# Patient Record
Sex: Female | Born: 1965 | Race: White | Hispanic: No | Marital: Married | Smoking: Never smoker
Health system: Southern US, Community
[De-identification: ages and names within clinical notes are randomized; demographics above are authoritative.]

## PROBLEM LIST (undated history)

## (undated) DIAGNOSIS — G8929 Other chronic pain: Secondary | ICD-10-CM

## (undated) DIAGNOSIS — R011 Cardiac murmur, unspecified: Secondary | ICD-10-CM

## (undated) DIAGNOSIS — Z86718 Personal history of other venous thrombosis and embolism: Secondary | ICD-10-CM

## (undated) DIAGNOSIS — I82409 Acute embolism and thrombosis of unspecified deep veins of unspecified lower extremity: Secondary | ICD-10-CM

## (undated) DIAGNOSIS — G43909 Migraine, unspecified, not intractable, without status migrainosus: Secondary | ICD-10-CM

## (undated) DIAGNOSIS — M797 Fibromyalgia: Secondary | ICD-10-CM

## (undated) DIAGNOSIS — M1990 Unspecified osteoarthritis, unspecified site: Secondary | ICD-10-CM

## (undated) DIAGNOSIS — D649 Anemia, unspecified: Secondary | ICD-10-CM

## (undated) DIAGNOSIS — R569 Unspecified convulsions: Secondary | ICD-10-CM

## (undated) DIAGNOSIS — M25511 Pain in right shoulder: Secondary | ICD-10-CM

## (undated) DIAGNOSIS — D689 Coagulation defect, unspecified: Secondary | ICD-10-CM

## (undated) DIAGNOSIS — R110 Nausea: Secondary | ICD-10-CM

## (undated) DIAGNOSIS — M549 Dorsalgia, unspecified: Secondary | ICD-10-CM

## (undated) DIAGNOSIS — E785 Hyperlipidemia, unspecified: Secondary | ICD-10-CM

## (undated) HISTORY — DX: Pain in right shoulder: M25.511

## (undated) HISTORY — PX: IVC FILTER INSERTION: CATH118245

## (undated) HISTORY — DX: Personal history of other venous thrombosis and embolism: Z86.718

## (undated) HISTORY — DX: Anemia, unspecified: D64.9

## (undated) HISTORY — DX: Nausea: R11.0

## (undated) HISTORY — PX: LEG SURGERY: SHX1003

## (undated) HISTORY — PX: SUBDURAL HEMATOMA EVACUATION VIA CRANIOTOMY: SUR319

## (undated) HISTORY — DX: Fibromyalgia: M79.7

## (undated) HISTORY — PX: LAPAROSCOPIC GASTRIC BANDING: SHX1100

## (undated) HISTORY — DX: Coagulation defect, unspecified: D68.9

---

## 1999-03-15 ENCOUNTER — Encounter: Payer: Self-pay | Admitting: Neurology

## 1999-03-15 ENCOUNTER — Ambulatory Visit (HOSPITAL_COMMUNITY): Admission: RE | Admit: 1999-03-15 | Discharge: 1999-03-15 | Payer: Self-pay | Admitting: Neurology

## 1999-03-21 ENCOUNTER — Ambulatory Visit: Admission: RE | Admit: 1999-03-21 | Discharge: 1999-03-21 | Payer: Self-pay | Admitting: Neurology

## 2000-02-15 ENCOUNTER — Other Ambulatory Visit: Admission: RE | Admit: 2000-02-15 | Discharge: 2000-02-15 | Payer: Self-pay | Admitting: Obstetrics and Gynecology

## 2003-08-25 ENCOUNTER — Other Ambulatory Visit: Admission: RE | Admit: 2003-08-25 | Discharge: 2003-08-25 | Payer: Self-pay | Admitting: Obstetrics and Gynecology

## 2004-05-12 ENCOUNTER — Emergency Department: Payer: Self-pay | Admitting: Emergency Medicine

## 2004-05-12 ENCOUNTER — Other Ambulatory Visit: Payer: Self-pay

## 2004-07-10 ENCOUNTER — Encounter: Admission: RE | Admit: 2004-07-10 | Discharge: 2004-07-10 | Payer: Self-pay | Admitting: Internal Medicine

## 2004-08-21 ENCOUNTER — Ambulatory Visit: Payer: Self-pay | Admitting: Pain Medicine

## 2004-08-23 ENCOUNTER — Ambulatory Visit: Payer: Self-pay | Admitting: Pain Medicine

## 2004-09-25 ENCOUNTER — Ambulatory Visit: Payer: Self-pay | Admitting: Pain Medicine

## 2004-10-05 ENCOUNTER — Ambulatory Visit: Payer: Self-pay | Admitting: Pain Medicine

## 2004-10-12 ENCOUNTER — Ambulatory Visit: Payer: Self-pay | Admitting: Pain Medicine

## 2004-10-19 ENCOUNTER — Ambulatory Visit: Payer: Self-pay | Admitting: Pain Medicine

## 2004-10-26 ENCOUNTER — Ambulatory Visit: Payer: Self-pay | Admitting: Pain Medicine

## 2004-11-16 ENCOUNTER — Ambulatory Visit: Payer: Self-pay | Admitting: Pain Medicine

## 2004-12-01 ENCOUNTER — Ambulatory Visit: Payer: Self-pay | Admitting: Physician Assistant

## 2005-01-29 ENCOUNTER — Ambulatory Visit: Payer: Self-pay | Admitting: Physician Assistant

## 2005-04-04 ENCOUNTER — Ambulatory Visit: Payer: Self-pay | Admitting: Pain Medicine

## 2006-08-27 ENCOUNTER — Encounter: Admission: RE | Admit: 2006-08-27 | Discharge: 2006-08-27 | Payer: Self-pay | Admitting: Orthopaedic Surgery

## 2006-09-16 ENCOUNTER — Ambulatory Visit (HOSPITAL_COMMUNITY): Admission: RE | Admit: 2006-09-16 | Discharge: 2006-09-17 | Payer: Self-pay | Admitting: Orthopaedic Surgery

## 2006-11-25 ENCOUNTER — Ambulatory Visit: Payer: Self-pay | Admitting: Internal Medicine

## 2007-04-30 ENCOUNTER — Ambulatory Visit (HOSPITAL_COMMUNITY): Admission: RE | Admit: 2007-04-30 | Discharge: 2007-04-30 | Payer: Self-pay | Admitting: Surgery

## 2007-05-01 ENCOUNTER — Ambulatory Visit (HOSPITAL_COMMUNITY): Admission: RE | Admit: 2007-05-01 | Discharge: 2007-05-01 | Payer: Self-pay | Admitting: Surgery

## 2007-05-19 ENCOUNTER — Encounter: Admission: RE | Admit: 2007-05-19 | Discharge: 2007-08-17 | Payer: Self-pay | Admitting: Surgery

## 2007-08-25 ENCOUNTER — Ambulatory Visit (HOSPITAL_COMMUNITY): Admission: RE | Admit: 2007-08-25 | Discharge: 2007-08-25 | Payer: Self-pay | Admitting: Surgery

## 2007-10-14 ENCOUNTER — Encounter: Admission: RE | Admit: 2007-10-14 | Discharge: 2008-01-12 | Payer: Self-pay | Admitting: Surgery

## 2007-10-24 ENCOUNTER — Ambulatory Visit (HOSPITAL_COMMUNITY): Admission: RE | Admit: 2007-10-24 | Discharge: 2007-10-24 | Payer: Self-pay | Admitting: Surgery

## 2007-10-27 ENCOUNTER — Inpatient Hospital Stay (HOSPITAL_COMMUNITY): Admission: RE | Admit: 2007-10-27 | Discharge: 2007-11-04 | Payer: Self-pay | Admitting: Surgery

## 2007-10-28 ENCOUNTER — Encounter (INDEPENDENT_AMBULATORY_CARE_PROVIDER_SITE_OTHER): Payer: Self-pay | Admitting: Surgery

## 2007-10-28 ENCOUNTER — Ambulatory Visit: Payer: Self-pay | Admitting: Surgery

## 2008-02-03 ENCOUNTER — Encounter: Admission: RE | Admit: 2008-02-03 | Discharge: 2008-02-03 | Payer: Self-pay | Admitting: Surgery

## 2008-05-04 ENCOUNTER — Encounter: Admission: RE | Admit: 2008-05-04 | Discharge: 2008-05-26 | Payer: Self-pay | Admitting: Surgery

## 2008-08-07 ENCOUNTER — Inpatient Hospital Stay: Payer: Self-pay | Admitting: General Surgery
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
## 2010-12-05 NOTE — Op Note (Signed)
NAMECHARLOTTA, Lorraine Hurst            ACCOUNT NO.:  [id number]   MEDICAL RECORD NO.:  [id number]          PATIENT TYPE:  INP   LOCATION:  1240                         FACILITY:  [facility]   PHYSICIAN:  Thornton Park. Daphine Deutscher, MD  DATE OF BIRTH:  [birth year]   DATE OF PROCEDURE:  10/29/2007  DATE OF DISCHARGE:                               OPERATIVE REPORT   PREOPERATIVE INDICATIONS:  Lekesha Claw is postop day #2 lap Roux-en-  Y gastric bypass and had had a good swallow yesterday.  This morning she  woke up with some nausea and retching.  Complained of distention and  abdominal pain.  She was seen by me and taken to the x-ray where  gastrografin swallow was administered.  This showed filling of the  pouch, no leak but it did not empty into the Roux limb.  Therefore I  recommend we take her to the operating room for laparoscopy and  endoscopy.   PROCEDURE:  Laparoscopy and upper endoscopy.   SURGEON:  Thornton Park. Daphine Deutscher, MD   ASSISTANT:  Anselm Pancoast. Zachery Dakins, M.D. and Sandria Bales. Ezzard Standing, M.D.   ANESTHESIA:  General endotracheal.   DESCRIPTION OF PROCEDURE:  Ms. Manthei was taken to OR 1 on the  afternoon of October 28, 1988 given general anesthesia.  The abdomen was  prepped with Techni-Care and draped sterilely.  I ended up opening three  incisions, two toward the umbilicus and one that I initially went in was  in left upper quadrant.  It was the initial site of the OptiVu approach.  Upon entering the abdomen, the entire small bowel was so distended I  could not really see anything.  I, however, had a safe entrance and did  not see any evidence of any fluid or contamination or leak.  I therefore  went straight away to the endoscopy and I passed the endoscope into the  small pouch, did not see any bleeding.  I was able to cannulate and get  into the small bowel which was distended.  I passed the scope easily  down the outlet as it was distended and sucked on this to decompress the  bowel.   As we performed I went ahead and had Dr. Zachery Dakins come up and  intermittently press the suction device.  I went back in with the  laparoscopic and gentle massaged the bowel and we got this area  deflated.  I was then able to put a second trocar in to the left of the  umbilicus and got the angled scope and put that in and was able to look  and identify the right colon which seemed to be distended with air.  The  transverse colon was distended with air and everything appeared to be as  if she had aerophagia with a lot of gas and an ileus.  There is no  transition point.  I was unable to see the JJ as the bowel loops were  pretty distended around there but as I massaged this and got it to  decompress.  I saw good peristaltic waves in the proximal foregut.  And  with sequential  massage again the suction seemed to decompress the  bowel.  We elected to at that point pass the NG tube into down near the  EG junction and stopped based on the impression that this was a big  ileus as there was gas throughout the bowel with dilatation and no  transition point.   In inspecting everything I could up underneath the liver and did not see  any evidence of any perforation.  With initial insufflation and  inspection there was no evidence of bubbles and no evidence of any leak  or peritonitis.  At completion I decompressed the abdomen of air and  then closed the three openings with staples.  The patient was then taken  back to the recovery room prior to transfer back to the ICU step-down  unit.      Thornton Park Daphine Deutscher, MD  Electronically Signed     MBM/MEDQ  D:  10/29/2007  T:  10/30/2007  Job:  [id number]

## 2010-12-05 NOTE — Discharge Summary (Signed)
NAMEAMADEA, KEAGY            ACCOUNT NO.:  [id number]   MEDICAL RECORD NO.:  [id number]          PATIENT TYPE:  INP   LOCATION:  1614                         FACILITY:  [facility]   PHYSICIAN:  Thornton Park. Daphine Deutscher, MD  DATE OF BIRTH:  1965/07/26   DATE OF ADMISSION:  10/27/2007  DATE OF DISCHARGE:  11/04/2007                               DISCHARGE SUMMARY   PROCEDURES:  1. On October 27, 2007, laparoscopic Roux-en-Y gastric bypass.  2. On October 29, 2007, laparoscopy with upper endoscopy and evacuation      of ileus.   HOSPITAL COURSE:  Lorraine Hurst is a 45 year old white female, BMI of  78, who came in and underwent a laparoscopic Roux-en-Y gastric bypass.  Postoperatively, her initial swallow looked good and she had retrieval  of a Greenfield filter placed preoperatively.  On Doppler study on October 28, 2007, had no evidence of DVT or superficial thrombosis.  On postop  day #2, she had some problems with retching and probably aerophagic as  she had preexistent reflux.  She got distended and having a lot of dry  heaves and pain, so I took her to the operating room on October 29, 2007,  and went back into one of her ports and found her distended bowel  throughout, consistent with an aerophagic ileus.  Concomitantly, we put  down the endoscope to look at her pouch and decompressed her through  that by going ahead and endoscoping the Roux-en-Y and evacuating a lot  of air.  At that time, I saw no evidence of any trocar hernias and no  internal hernias and there was no evidence of a leak.  I went ahead and  put her on TNA for a few days and we watched her until she was passing  gas and having some loose stools. In the meantime, had on TNA which we  weaned and started her back on the protein shakes.  She was getting  around a little slowly and not quite ready for discharge on November 03, 2007, but by November 04, 2007, she was taking her shakes and ready for  discharge.  Her staples will be  removed prior to discharge.  We have  asked her to see Dr. Bethann Punches as soon as possible to change some of  her meds, possibly going to a liquid form of potassium chloride.  I have  also instructed her to restart her Coumadin tomorrow, and she will  follow up with him in 1 week.  I will see her back in 2-3 weeks since  she has been here in the hospital and will not need to come and see me  tomorrow unless needed.  Condition stable and improved.  Hemoglobin  stable.   FINAL DIAGNOSIS:  Morbidly obese young lady, status post laparoscopic  Roux-en-Y gastric bypass beginning with a body mass index of 63.      Thornton Park Daphine Deutscher, MD  Electronically Signed     MBM/MEDQ  D:  11/04/2007  T:  11/04/2007  Job:  [id number]

## 2010-12-05 NOTE — Op Note (Signed)
Lorraine Hurst, Lorraine Hurst            ACCOUNT NO.:  [id number]   MEDICAL RECORD NO.:  [id number]          PATIENT TYPE:  INP   LOCATION:  1240                         FACILITY:  [facility]   PHYSICIAN:  Sharlet Salina T. Hoxworth, M.D.DATE OF BIRTH:  May 13, 1966   DATE OF PROCEDURE:  10/27/2007  DATE OF DISCHARGE:                               OPERATIVE REPORT   PROCEDURE:  Upper gastrointestinal endoscopy.   DESCRIPTION OF PROCEDURE:  Upper GI endoscopy is performed at the  completion of laparoscopic Roux-en-Y gastric bypass by Dr. Daphine Deutscher.  The  Olympus video endoscope was inserted into the upper esophagus and then  passed under direct vision to the EG junction.  There was a small  sliding hiatal hernia.  The small gastric pouch was entered and then was  tensely distended with air while the outlet was clamped under saline  irrigation by Dr. Daphine Deutscher.  There was no evidence of leak.  The  anastomosis was visualized and patent.  Suture and staple lines appeared  intact and without bleeding.  The gastric mucosa was normal.  The pouch  was small measuring about 3-4 cm in length.  Following completion of  procedure the pouch was desufflated and the scope withdrawn.      [name]. Hoxworth, M.D.  Electronically Signed     BTH/MEDQ  D:  10/27/2007  T:  10/27/2007  Job:  [id number]

## 2011-04-16 LAB — BASIC METABOLIC PANEL
BUN: 15
CO2: 31
Chloride: 96
Creatinine, Ser: 0.75

## 2011-04-17 LAB — CBC
HCT: 43.3
HCT: 44.4
Hemoglobin: 11.7 — ABNORMAL LOW
Hemoglobin: 12.3
Hemoglobin: 12.8
Hemoglobin: 13.2
Hemoglobin: 14
Hemoglobin: 14.8
MCHC: 32.3
MCHC: 33.4
MCHC: 33.9
MCHC: 34.6
MCV: 79.7
MCV: 80.7
MCV: 80.7
MCV: 80.9
Platelets: 186
Platelets: 197
Platelets: 256
RBC: 4.6
RBC: 4.86
RDW: 14.3
RDW: 14.8
RDW: 14.9
RDW: 15
RDW: 15.1
WBC: 11.4 — ABNORMAL HIGH
WBC: 12.5 — ABNORMAL HIGH
WBC: 12.5 — ABNORMAL HIGH

## 2011-04-17 LAB — DIFFERENTIAL
Basophils Absolute: 0
Basophils Absolute: 0
Basophils Absolute: 0.1
Basophils Relative: 0
Basophils Relative: 1
Basophils Relative: 10 — ABNORMAL HIGH
Eosinophils Absolute: 0
Eosinophils Absolute: 0.1
Eosinophils Absolute: 0.2
Eosinophils Absolute: 0.2
Eosinophils Relative: 1
Lymphocytes Relative: 5 — ABNORMAL LOW
Lymphs Abs: 0.5 — ABNORMAL LOW
Lymphs Abs: 1.1
Lymphs Abs: 1.2
Lymphs Abs: 1.5
Monocytes Absolute: 0.6
Monocytes Absolute: 0.9
Monocytes Absolute: 1
Monocytes Absolute: 1.1 — ABNORMAL HIGH
Monocytes Relative: 10
Monocytes Relative: 8
Monocytes Relative: 9
Neutro Abs: 10 — ABNORMAL HIGH
Neutro Abs: 11.6 — ABNORMAL HIGH
Neutro Abs: 12.5 — ABNORMAL HIGH
Neutro Abs: 26.1 — ABNORMAL HIGH
Neutro Abs: 7.5
Neutrophils Relative %: 66
Neutrophils Relative %: 80 — ABNORMAL HIGH
Neutrophils Relative %: 85 — ABNORMAL HIGH

## 2011-04-17 LAB — BASIC METABOLIC PANEL
BUN: 18
CO2: 24
CO2: 28
Chloride: 102
Chloride: 105
Chloride: 94 — ABNORMAL LOW
Creatinine, Ser: 0.57
GFR calc Af Amer: >60
Glucose, Bld: 119 — ABNORMAL HIGH
Glucose, Bld: 209 — ABNORMAL HIGH
Potassium: 3.3 — ABNORMAL LOW
Potassium: 4.2
Sodium: 135
Sodium: 138
Sodium: 138

## 2011-04-17 LAB — COMPREHENSIVE METABOLIC PANEL
ALT: 50 — ABNORMAL HIGH
AST: 12
Albumin: 2.3 — ABNORMAL LOW
Alkaline Phosphatase: 43
Calcium: 8.7
GFR calc Af Amer: >60
Glucose, Bld: 148 — ABNORMAL HIGH
Potassium: 4
Sodium: 139
Total Protein: 5 — ABNORMAL LOW

## 2011-04-17 LAB — PROTIME-INR: Prothrombin Time: 13.1

## 2011-04-17 LAB — HEMOGLOBIN AND HEMATOCRIT, BLOOD
HCT: 37.9
Hemoglobin: 12.3
Hemoglobin: 12.8

## 2011-04-17 LAB — APTT: aPTT: 28

## 2011-04-17 LAB — TRIGLYCERIDES: Triglycerides: 133

## 2011-04-20 ENCOUNTER — Telehealth (INDEPENDENT_AMBULATORY_CARE_PROVIDER_SITE_OTHER): Payer: Self-pay | Admitting: Surgery

## 2011-04-20 NOTE — Telephone Encounter (Signed)
04/20/11 recall letter mailed to pt for gastric bypass f/u. Advised pt to call our office to schedule an appt...cef

## 2011-07-23 ENCOUNTER — Inpatient Hospital Stay: Payer: Self-pay | Admitting: Internal Medicine

## 2011-07-24 LAB — CBC WITH DIFFERENTIAL/PLATELET
Basophil %: 0 %
Eosinophil #: 0 10*3/uL (ref 0.0–0.7)
HCT: 34 % — ABNORMAL LOW (ref 35.0–47.0)
HGB: 11.2 g/dL — ABNORMAL LOW (ref 12.0–16.0)
Lymphocyte %: 8.7 %
MCHC: 33 g/dL (ref 32.0–36.0)
Neutrophil #: 11.1 10*3/uL — ABNORMAL HIGH (ref 1.4–6.5)
WBC: 13.8 10*3/uL — ABNORMAL HIGH (ref 3.6–11.0)

## 2011-07-24 LAB — URINALYSIS, COMPLETE
Bilirubin,UR: NEGATIVE
Ph: 7 (ref 4.5–8.0)
Protein: 30
RBC,UR: 8 /HPF (ref 0–5)
Specific Gravity: 1.025 (ref 1.003–1.030)
Squamous Epithelial: 5
WBC UR: 5 /HPF (ref 0–5)

## 2011-07-24 LAB — BASIC METABOLIC PANEL
Chloride: 105 mmol/L (ref 98–107)
Co2: 27 mmol/L (ref 21–32)
Creatinine: 0.71 mg/dL (ref 0.60–1.30)
Sodium: 141 mmol/L (ref 136–145)

## 2011-07-25 LAB — CBC WITH DIFFERENTIAL/PLATELET
Basophil #: 0 10*3/uL (ref 0.0–0.1)
Basophil %: 0.4 %
Eosinophil #: 0.1 10*3/uL (ref 0.0–0.7)
HCT: 34.3 % — ABNORMAL LOW (ref 35.0–47.0)
HGB: 11.4 g/dL — ABNORMAL LOW (ref 12.0–16.0)
Lymphocyte %: 16.9 %
MCV: 87 fL (ref 80–100)
Monocyte #: 0.7 10*3/uL (ref 0.0–0.7)
Monocyte %: 9.1 %
Neutrophil #: 5.9 10*3/uL (ref 1.4–6.5)
RDW: 13.4 % (ref 11.5–14.5)

## 2011-07-25 LAB — BASIC METABOLIC PANEL
BUN: 7 mg/dL (ref 7–18)
Co2: 26 mmol/L (ref 21–32)
Creatinine: 0.57 mg/dL — ABNORMAL LOW (ref 0.60–1.30)
EGFR (African American): >60
EGFR (Non-African Amer.): >60

## 2011-07-25 LAB — VANCOMYCIN, TROUGH: Vancomycin, Trough: 20 ug/mL (ref 10–20)

## 2011-07-26 LAB — CBC WITH DIFFERENTIAL/PLATELET
HCT: 32.4 % — ABNORMAL LOW (ref 35.0–47.0)
Lymphocyte #: 1.8 10*3/uL (ref 1.0–3.6)
MCH: 28.7 pg (ref 26.0–34.0)
MCV: 87 fL (ref 80–100)
Monocyte #: 0.7 10*3/uL (ref 0.0–0.7)
Monocyte %: 10 %
Neutrophil #: 3.9 10*3/uL (ref 1.4–6.5)
Platelet: 174 10*3/uL (ref 150–440)
RDW: 13.3 % (ref 11.5–14.5)
WBC: 6.5 10*3/uL (ref 3.6–11.0)

## 2011-07-26 LAB — BASIC METABOLIC PANEL
Calcium, Total: 8.6 mg/dL (ref 8.5–10.1)
EGFR (Non-African Amer.): >60
Glucose: 77 mg/dL (ref 65–99)
Potassium: 4.2 mmol/L (ref 3.5–5.1)
Sodium: 144 mmol/L (ref 136–145)

## 2011-08-16 ENCOUNTER — Ambulatory Visit: Payer: Self-pay | Admitting: Internal Medicine

## 2011-09-06 ENCOUNTER — Ambulatory Visit: Payer: Self-pay | Admitting: Podiatry

## 2011-10-02 ENCOUNTER — Ambulatory Visit: Payer: Self-pay | Admitting: Anesthesiology

## 2011-10-02 LAB — HEMOGLOBIN: HGB: 12.8 g/dL (ref 12.0–16.0)

## 2011-10-04 ENCOUNTER — Ambulatory Visit: Payer: Self-pay | Admitting: Otolaryngology

## 2011-10-04 LAB — PREGNANCY, URINE: Pregnancy Test, Urine: NEGATIVE m[IU]/mL

## 2011-10-05 LAB — PATHOLOGY REPORT

## 2012-07-24 ENCOUNTER — Telehealth (INDEPENDENT_AMBULATORY_CARE_PROVIDER_SITE_OTHER): Payer: Self-pay | Admitting: Surgery

## 2012-07-24 NOTE — Telephone Encounter (Signed)
06/13/12 mailed recall letter for bariatric surgery follow-up to pt. Advised pt to call [facility] at [phone] to °schedule appt. (lss) ° °

## 2012-12-05 ENCOUNTER — Telehealth: Payer: Self-pay | Admitting: Neurology

## 2014-11-14 NOTE — Discharge Summary (Signed)
PATIENT NAME:  Lorraine Hurst, [name] MR#:  [id number] DATE OF BIRTH:  [birth year]  DATE OF ADMISSION:  07/23/2011 DATE OF DISCHARGE:  07/26/2011  FINAL DIAGNOSES:  1. Acute bilateral tonsillitis.  2. Sepsis secondary to #1.  3. Cervical lymphadenitis.  4. Migraine headaches.  5. Sleep apnea.  6. Remote history of deep venous thrombosis.  7. History of lap band procedure.   HISTORY AND PHYSICAL: Please see dictated admission history and physical.   HOSPITAL COURSE: The patient was admitted with a severe sore throat, marked tonsillitis, no evidence of sepsis as indicated by fever, tachycardia, elevation in white blood cell count, low blood pressure. Fortunately, she responded to broad-spectrum antibiotics and fluids. Culture of the throat was sent, which grew out only normal oral flora. ENT saw the patient and recommended continued IV antibiotics. This was changed over to Augmentin once her toleration of orals was evident and she showed continued improvement on this, with resolution of fevers, normalization of white blood cell count, heart rate, and blood pressure. She was tolerating diet and this was advanced to soft without significant issues.   ENT recommended that she follow-up with them after completion of her antibiotics for consideration for a tonsillectomy and this will be arranged. She will otherwise be discharged home in stable condition with physical activity up as tolerated. She may return to work within one week. She should follow a bland soft diet for the next one week, then go to low sodium. She certainly may use full liquids instead if she feels like this is still bothering her throat. Anticipation is for her follow-up with Dr. Gertie BaronMadison Clark in the next two weeks.   DISCHARGE MEDICATIONS:  1. Augmentin 875 mg p.o. twice a day with food x10 days.  2. Norco 5/325 milligrams, 1 p.o. every six hours p.r.n. severe pain.  3. Klonopin 1 mg p.o. at bedtime.  4. Mag-Ox 400 mg p.o. daily, to  be restarted in one week.  5. Lamotrigine 100 mg p.o. at bedtime.  6. Triamcinolone cream twice a day as needed for rash, which appears to be a heat rash.  7. Torsemide 20 mg daily, may go to 20 mg b.i.d. if needed for edema.  8. Potassium 20 mEq p.o. daily, may go to b.i.d. if using the torsemide twice a day.  9. Vitamin D 1000 units p.o. daily.  10. Imitrex 100 mg p.o. daily as needed for migraine.    ____________________________ Lynnea FerrierBert J. Juanangel Soderholm III, MD bjk:rbg D: 07/26/2011 07:10:13 ET T: 07/26/2011 14:48:56 ET JOB#: [id number]  cc: Curtis SitesBert J. Jemel Ono III, MD, <Dictator> Daniel NonesBERT Kyla Duffy MD ELECTRONICALLY SIGNED 07/30/2011 7:38

## 2014-11-14 NOTE — Op Note (Signed)
PATIENT NAME:  Lorraine Hurst, [name] MR#:  [id number] DATE OF BIRTH:  03-17-1966  DATE OF PROCEDURE:  10/04/2011  PREOPERATIVE DIAGNOSIS:  Chronic tonsillitis.  POSTOPERATIVE DIAGNOSIS:  Chronic tonsillitis.  OPERATION:  Tonsillectomy.  SURGEON:  Zackery BarefootJ. Madison Jacon Whetzel, MD  ANESTHESIA:  General endotracheal.  OPERATIVE FINDINGS:  The tonsils were 3+ chronically and cryptically inflamed.  DESCRIPTION OF THE PROCEDURE:  Darl PikesSusan was identified in the holding area and taken to the operating room and placed in the supine position.  After general endotracheal anesthesia, the table was turned 45 degrees and the patient was draped in the usual fashion for a tonsillectomy.  A mouth gag was inserted into the oral cavity and examination of the oropharynx showed the uvula was non-bifid.  There was no evidence of submucous cleft to the palate.  There were large tonsils.  Beginning on the left-hand side a tenaculum was used to grasp the tonsil and the Bovie cautery was used to dissect it free from the fossa.  In a similar fashion, the right tonsil was removed.  Meticulous hemostasis was achieved using the Bovie cautery.  With both tonsils removed and no active bleeding, 0.5% plain Marcaine was used to inject the anterior and posterior tonsillar pillars bilaterally.  A total of 6 mL was used.  The patient tolerated the procedure well and was awakened in the operating room and taken to the recovery room in stable condition.   CULTURES:  None. SPECIMENS:  Tonsils. ESTIMATED BLOOD LOSS:  Less than 10 mL. ____________________________ J. Gertie BaronMadison Salif Tay, MD jmc:cbb D: 10/04/2011 07:55:01 ET T: 10/04/2011 10:09:02 ET JOB#: [id number] Wendee CoppJMADISON Tinleigh Whitmire MD ELECTRONICALLY SIGNED 10/05/2011 5:53
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
## 2014-11-14 NOTE — Consult Note (Signed)
PATIENT NAME:  Lorraine Hurst, Lorraine Hurst MR#:  [id number] DATE OF BIRTH:  [birth year]  DATE OF CONSULTATION:  07/23/2011  REFERRING PHYSICIAN:  Dr. Daniel NonesBert Klein CONSULTING PHYSICIAN:  Zackery BarefootJ. Madison Shantelle Alles, MD  HISTORY OF PRESENT ILLNESS: The patient is a 49 year old white female who [facility] with odynophagia and inability to handle her own secretions. She had this occur over a fairly rapid 48 hour timeframe. There was some question as to whether or not the patient had a peritonsillar abscess. The patient's husband reports that "ever since her gastric bypass surgery she gets sick really quickly".   PAST MEDICAL HISTORY: 1. Migraine headaches. 2. Sleep apnea. 3. Deep vein thrombosis in the right leg in 2005.  4. Anemia. 5. Leg surgery in 1998. 6. Lap band procedure 2009. 7. History of IVC filter placement.    PHYSICAL EXAMINATION:  GENERAL: The patient is in significant discomfort and has a "hot potato voice".   ORAL CAVITY AND OROPHARYNX: The tonsils are 4+ with white-gray exudate over both tonsils, moderate peritonsillar erythema. Airway is safe.   NECK: There is palpable adenopathy in levels two and three bilaterally, tender to palpation.   NARES: The septum is midline. No purulence.   IMPRESSION: Acute exudative tonsillitis and pharyngitis with acute lymphadenitis.      RECOMMENDATIONS: I have recommended the addition of Decadron to the IV antibiotics as well as humidifier at the bedside. The patient will consider elective tonsillectomy as an outpatient.   ____________________________ J. Gertie BaronMadison Raylynn Hersh, MD jmc:cms D: 07/23/2011 23:09:21 ET T: 07/25/2011 08:53:18 ET JOB#: [id number]  cc: Zackery BarefootJ. Madison Alexzavier Girardin, MD, <Dictator> Glorious PeachSonya [facility] Wendee CoppJMADISON Amit Meloy MD ELECTRONICALLY SIGNED 09/05/2011 8:16

## 2015-05-23 ENCOUNTER — Encounter: Payer: Self-pay | Admitting: Medical Oncology

## 2015-05-23 ENCOUNTER — Emergency Department: Payer: BLUE CROSS/BLUE SHIELD

## 2015-05-23 ENCOUNTER — Emergency Department
Admission: EM | Admit: 2015-05-23 | Discharge: 2015-05-23 | Disposition: A | Discharge status: Home / Self Care | Payer: BLUE CROSS/BLUE SHIELD | Attending: Emergency Medicine | Admitting: Emergency Medicine

## 2015-05-23 DIAGNOSIS — R4689 Other symptoms and signs involving appearance and behavior: Secondary | ICD-10-CM

## 2015-05-23 DIAGNOSIS — R112 Nausea with vomiting, unspecified: Secondary | ICD-10-CM | POA: Diagnosis present

## 2015-05-23 DIAGNOSIS — F919 Conduct disorder, unspecified: Secondary | ICD-10-CM | POA: Diagnosis not present

## 2015-05-23 HISTORY — DX: Dorsalgia, unspecified: M54.9

## 2015-05-23 HISTORY — DX: Migraine, unspecified, not intractable, without status migrainosus: G43.909

## 2015-05-23 HISTORY — DX: Acute embolism and thrombosis of unspecified deep veins of unspecified lower extremity: I82.409

## 2015-05-23 HISTORY — DX: Hyperlipidemia, unspecified: E78.5

## 2015-05-23 HISTORY — DX: Cardiac murmur, unspecified: R01.1

## 2015-05-23 HISTORY — DX: Unspecified osteoarthritis, unspecified site: M19.90

## 2015-05-23 HISTORY — DX: Unspecified convulsions: R56.9

## 2015-05-23 HISTORY — DX: Other chronic pain: G89.29

## 2015-05-23 LAB — COMPREHENSIVE METABOLIC PANEL
ALBUMIN: 4.5 g/dL (ref 3.5–5.0)
ALT: 17 U/L (ref 14–54)
ANION GAP: 10 (ref 5–15)
AST: 19 U/L (ref 15–41)
Alkaline Phosphatase: 64 U/L (ref 38–126)
BILIRUBIN TOTAL: 0.5 mg/dL (ref 0.3–1.2)
BUN: 22 mg/dL — AB (ref 6–20)
CHLORIDE: 100 mmol/L — AB (ref 101–111)
CO2: 24 mmol/L (ref 22–32)
Calcium: 9.2 mg/dL (ref 8.9–10.3)
Creatinine, Ser: 0.72 mg/dL (ref 0.44–1.00)
GFR calc Af Amer: >60 mL/min (ref >60)
GFR calc non Af Amer: >60 mL/min (ref >60)
GLUCOSE: 115 mg/dL — AB (ref 65–99)
POTASSIUM: 3.5 mmol/L (ref 3.5–5.1)
SODIUM: 134 mmol/L — AB (ref 135–145)
Total Protein: 7.6 g/dL (ref 6.5–8.1)

## 2015-05-23 LAB — CBC
HEMATOCRIT: 44.5 % (ref 35.0–47.0)
Hemoglobin: 14.8 g/dL (ref 12.0–16.0)
MCH: 28.4 pg (ref 26.0–34.0)
MCHC: 33.2 g/dL (ref 32.0–36.0)
MCV: 85.5 fL (ref 80.0–100.0)
Platelets: 214 10*3/uL (ref 150–440)
RBC: 5.2 MIL/uL (ref 3.80–5.20)
RDW: 13.6 % (ref 11.5–14.5)
WBC: 10.2 10*3/uL (ref 3.6–11.0)

## 2015-05-23 MED ORDER — ONDANSETRON HCL 4 MG/2ML IJ SOLN
4.0000 mg | Freq: Once | INTRAMUSCULAR | Status: AC
  Administered 2015-05-23: 4 mg via INTRAVENOUS
  Filled 2015-05-23: qty 2

## 2015-05-23 MED ORDER — TOPIRAMATE 25 MG PO TABS
100.0000 mg | ORAL_TABLET | Freq: Once | ORAL | Status: AC
  Administered 2015-05-23: 100 mg via ORAL
  Filled 2015-05-23: qty 4

## 2015-05-23 MED ORDER — LAMOTRIGINE 25 MG PO TABS
ORAL_TABLET | ORAL | Status: AC
  Administered 2015-05-23: 100 mg via ORAL
  Filled 2015-05-23: qty 4

## 2015-05-23 MED ORDER — LAMOTRIGINE 25 MG PO TABS
100.0000 mg | ORAL_TABLET | Freq: Once | ORAL | Status: AC
  Administered 2015-05-23 (×2): 100 mg via ORAL
  Filled 2015-05-23: qty 4

## 2015-05-23 MED ORDER — LORAZEPAM 2 MG/ML IJ SOLN
1.0000 mg | Freq: Once | INTRAMUSCULAR | Status: AC
  Administered 2015-05-23: 1 mg via INTRAVENOUS
  Filled 2015-05-23: qty 1

## 2015-05-23 MED ORDER — SODIUM CHLORIDE 0.9 % IV BOLUS (SEPSIS)
1000.0000 mL | Freq: Once | INTRAVENOUS | Status: AC
  Administered 2015-05-23: 1000 mL via INTRAVENOUS

## 2015-05-23 MED ORDER — CLONAZEPAM 0.5 MG PO TABS
1.0000 mg | ORAL_TABLET | Freq: Once | ORAL | Status: AC
  Administered 2015-05-23: 1 mg via ORAL
  Filled 2015-05-23: qty 2

## 2015-05-23 NOTE — ED Provider Notes (Signed)
[facility] Emergency Department Provider Note    ____________________________________________  Time seen: 1610  I have reviewed the triage vital signs and the nursing notes.   HISTORY  Chief Complaint Emesis   History limited by: Abnormal behavior.    HPI Lorraine Hurst is a 49 y.o. female with history of migraines who presents to the emergency department today because of concerns for abnormal behavior. The patient was at her neurologist today and underwent an S PEG. During this procedure she started becoming nauseous. Per neurology notes it appears she had a non-epileptic episode. The husband states that she was driving her home she continued to be nauseous and not behaving like her normal self. He did become concerned and brought her to the emergency department.   Past Medical History  Diagnosis Date  . Migraine   . Seizures (HCC)   . Murmur   . Chronic back pain   . Acute DVT (deep venous thrombosis) (HCC)   . Hyperlipemia   . Arthritis     There are no active problems to display for this patient.   No past surgical history on file.  No current outpatient prescriptions on file.  Allergies Review of patient's allergies indicates no known allergies.  No family history on file.  Social History Social History  Substance Use Topics  . Smoking status: Never Smoker   . Smokeless tobacco: None  . Alcohol Use: No    Review of Systems  Constitutional: Negative for fever. Cardiovascular: Negative for chest pain. Respiratory: Negative for shortness of breath. Gastrointestinal: Positive for nausea Genitourinary: Negative for dysuria. Musculoskeletal: Negative for back pain. Skin: Negative for rash. Neurological: Negative for headaches, focal weakness or numbness.  10-point ROS otherwise negative.  ____________________________________________   PHYSICAL EXAM:  VITAL SIGNS: ED Triage Vitals  Enc Vitals Group     BP 05/23/15  1619 146/82 mmHg     Pulse Rate 05/23/15 1619 77     Resp 05/23/15 1619 18     Temp 05/23/15 1619 97.5 F (36.4 C)     Temp Source 05/23/15 1619 Axillary     SpO2 05/23/15 1619 100 %     Weight 05/23/15 1619 250 lb (113.399 kg)     Height 05/23/15 1619  (1.575 m)   Constitutional: Awake and alert. Eyes: Conjunctivae are normal. PERRL. Normal extraocular movements. ENT   Head: Normocephalic and atraumatic.   Nose: No congestion/rhinnorhea.   Mouth/Throat: Mucous membranes are moist.   Neck: No stridor. Hematological/Lymphatic/Immunilogical: No cervical lymphadenopathy. Cardiovascular: Normal rate, regular rhythm.  No murmurs, rubs, or gallops. Respiratory: Normal respiratory effort without tachypnea nor retractions. Breath sounds are clear and equal bilaterally. No wheezes/rales/rhonchi. Gastrointestinal: Soft and nontender. No distention.  Genitourinary: Deferred Musculoskeletal: Normal range of motion in all extremities. No joint effusions.  No lower extremity tenderness nor edema. Neurologic:  Patient moving all extremities. Pupils equal round reactive. Slightly jumbled speech however does not appear necessarily slurred. Skin:  Skin is warm, dry and intact. No rash noted. Psychiatric: Mood and affect are normal. Speech and behavior are normal. Patient exhibits appropriate insight and judgment.  ____________________________________________    LABS (pertinent positives/negatives)  Labs Reviewed  COMPREHENSIVE METABOLIC PANEL - Abnormal; Notable for the following:    Sodium 134 (*)    Chloride 100 (*)    Glucose, Bld 115 (*)    BUN 22 (*)    All other components within normal limits  CBC  URINALYSIS COMPLETEWITH MICROSCOPIC ([facility] ONLY)  ____________________________________________   EKG  None  ____________________________________________    RADIOLOGY  CT abdomen and pelvis IMPRESSION: 1. No acute intracranial abnormality. 2.  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 Query sequela of prior trauma.  ____________________________________________   PROCEDURES  Procedure(s) performed: None  Critical Care performed: No  ____________________________________________   INITIAL IMPRESSION / ASSESSMENT AND PLAN / ED COURSE  Pertinent labs & imaging results that were available during my care of the patient were reviewed by me and considered in my medical decision making (see chart for details).  Patient brought to the emergency department today because of concerns for abnormal behavior nausea. Per neurology doctors patient likely had a non-epileptic episode at the office. On exam here patient was intermittently out of it. No obvious seizure activity. No signs of any stroke without any focal neuro deficits. Patient was given fluids, Ativan and Zofran. At the time of discharge patient was back to her baseline. She was able to speak completely normally. Given the presentation I do have a strong suspicion that this was likely more psychologic then any other concerning pathology. I did discuss with the patient that it would likely be beneficial for her to follow-up with his psychiatrist or psychologist. Patient appeared to be receptive.  ____________________________________________   FINAL CLINICAL IMPRESSION(S) / ED DIAGNOSES  Final diagnoses:  Abnormal behavior     Phineas SemenGraydon Janica Eldred, MD 05/23/15 2244

## 2015-05-23 NOTE — ED Notes (Signed)
Husband states she has had increased seizure activity. States MD is unaware of this.

## 2015-05-23 NOTE — ED Notes (Signed)
Pt was at dr shah's office pta and she was treated for her migraines with SPG- after the treatment pt began having vomiting, also per husband pt has been having seizures. Pt has occasional jerky movements in triage.

## 2015-05-23 NOTE — ED Notes (Signed)
Patient is improving. Speech is better. Fluids continuing and zofran given.

## 2015-05-23 NOTE — Discharge Instructions (Signed)
Please seek medical attention for any high fevers, chest pain, shortness of breath, change in behavior, persistent vomiting, bloody stool or any other new or concerning symptoms.  Nonepileptic Seizures Nonepileptic seizures are seizures that are not caused by abnormal electrical signals in your brain. These seizures often seem like epileptic seizures, but they are not caused by epilepsy.  There are two types of nonepileptic seizures:  A physiologic nonepileptic seizure results from a disruption in your brain.  A psychogenic seizure results from emotional stress. These seizures are sometimes called pseudoseizures. CAUSES  Causes of physiologic nonepileptic seizures include:   Sudden drop in blood pressure.  Low blood sugar.  Low levels of salt (sodium) in your blood.  Low levels of calcium in your blood.  Migraine.  Heart rhythm problems.  Sleep disorders.  Drug and alcohol abuse. Common causes of psychogenic nonepileptic seizures include:  Stress.  Emotional trauma.  Sexual or physical abuse.  Major life events, such as divorce or the death of a loved one.  Mental health disorders, including panic attack and hyperactivity disorder. SIGNS AND SYMPTOMS A nonepileptic seizure can look like an epileptic seizure, including uncontrollable shaking (convulsions), or changes in attention, behavior, or the ability to remain awake and alert. However, there are some differences. Nonepileptic seizures usually:  Do not cause physical injuries.  Start slowly.  Include crying or shrieking.  Last longer than 2 minutes.  Have a short recovery time without headache or exhaustion. DIAGNOSIS  Your health care provider can usually diagnose nonepileptic seizures after taking your medical history and giving you a physical exam. Your health care provider may want to talk to your friends or relatives who have seen you have a seizure.  You may also need to have tests to look for causes of  physiologic nonepileptic seizures. This may include an electroencephalogram (EEG), which is a test that measures electrical activity in your brain. If you have had an epileptic seizure, the results of your EEG will be abnormal. If your health care provider thinks you have had a psychogenic nonepileptic seizure, you may need to see a mental health specialist for an evaluation. TREATMENT  Treatment depends on the type and cause of your seizures.  For physiologic nonepileptic seizures, treatment is aimed at addressing the underlying condition that caused the seizures. These seizures usually stop when the underlying condition is properly treated.  Nonepileptic seizures do not respond to the seizure medicines used to treat epilepsy.  For psychogenic seizures, you may need to work with a mental health specialist. HOME CARE INSTRUCTIONS Home care will depend on the type of nonepileptic seizures you have.   Follow all your health care provider's instructions.  Keep all your follow-up appointments. SEEK MEDICAL CARE IF: You continue to have seizures after treatment. SEEK IMMEDIATE MEDICAL CARE IF:  Your seizures change or become more frequent.  You injure yourself during a seizure.  You have one seizure after another.  You have trouble recovering from a seizure.  You have chest pain or trouble breathing. MAKE SURE YOU:  Understand these instructions.  Will watch your condition.  Will get help right away if you are not doing well or get worse.   This information is not intended to replace advice given to you by your health care provider. Make sure you discuss any questions you have with your health care provider.   Document Released: 08/24/2005 Document Revised: 07/30/2014 Document Reviewed: 05/05/2013 Elsevier Interactive Patient Education [employer]2016 [employer].

## 2015-06-15 ENCOUNTER — Other Ambulatory Visit: Payer: Self-pay | Admitting: Orthopaedic Surgery

## 2015-06-15 DIAGNOSIS — M545 Low back pain: Secondary | ICD-10-CM

## 2015-07-01 ENCOUNTER — Ambulatory Visit
Admission: RE | Admit: 2015-07-01 | Discharge: 2015-07-01 | Disposition: A | Discharge status: Home / Self Care | Payer: BLUE CROSS/BLUE SHIELD | Source: Ambulatory Visit | Attending: Orthopaedic Surgery | Admitting: Orthopaedic Surgery

## 2015-07-01 DIAGNOSIS — M545 Low back pain: Secondary | ICD-10-CM

## 2016-04-30 ENCOUNTER — Ambulatory Visit: Payer: BLUE CROSS/BLUE SHIELD | Attending: Physical Medicine and Rehabilitation | Admitting: Physical Therapy

## 2016-05-10 ENCOUNTER — Ambulatory Visit: Payer: BLUE CROSS/BLUE SHIELD | Admitting: Physical Therapy

## 2016-05-17 ENCOUNTER — Ambulatory Visit: Payer: BLUE CROSS/BLUE SHIELD | Admitting: Physical Therapy

## 2016-05-24 ENCOUNTER — Ambulatory Visit: Payer: BLUE CROSS/BLUE SHIELD | Admitting: Physical Therapy

## 2016-05-29 ENCOUNTER — Encounter (HOSPITAL_COMMUNITY): Payer: Self-pay

## 2016-05-29 ENCOUNTER — Ambulatory Visit: Payer: BLUE CROSS/BLUE SHIELD | Admitting: Physical Therapy

## 2016-05-31 ENCOUNTER — Ambulatory Visit: Payer: BLUE CROSS/BLUE SHIELD | Admitting: Physical Therapy

## 2016-06-05 ENCOUNTER — Ambulatory Visit: Payer: BLUE CROSS/BLUE SHIELD | Admitting: Physical Therapy

## 2016-06-07 ENCOUNTER — Ambulatory Visit: Payer: BLUE CROSS/BLUE SHIELD | Admitting: Physical Therapy

## 2016-12-06 DIAGNOSIS — M5416 Radiculopathy, lumbar region: Secondary | ICD-10-CM | POA: Diagnosis not present

## 2017-05-14 ENCOUNTER — Encounter (HOSPITAL_COMMUNITY): Payer: Self-pay

## 2017-09-03 IMAGING — CT CT HEAD W/O CM
1 series · 16 of 30 positions shown, 20 images · non-contrast
Comparison: None.

CLINICAL DATA: 48-year-old with migraines, vomiting, and recent
increased seizure activity.

EXAM:
CT HEAD WITHOUT CONTRAST
TECHNIQUE: Contiguous axial images were obtained from the base of the skull
through the vertex without intravenous contrast.

[Series 2: head wo · axial · 0.43mm/px · z∈[-105,+21]mm · 16 of 32 slices shown, 20 images]
[im 2/32  brain]
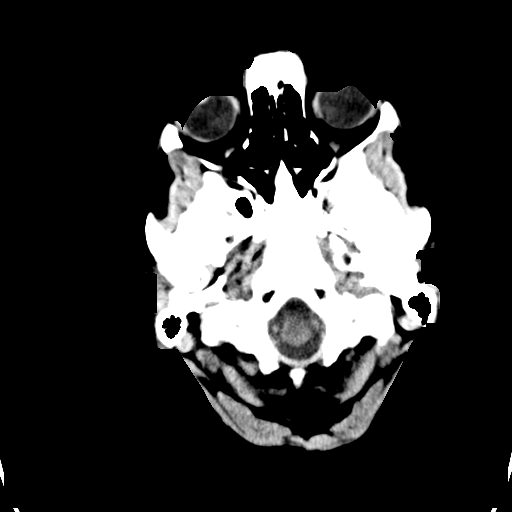
[im 2/32  bone]
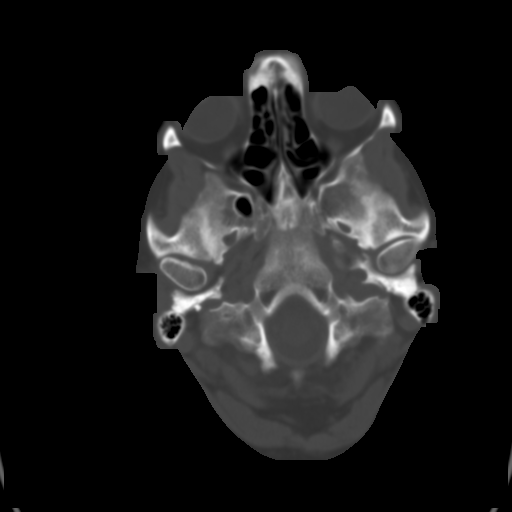
[im 4/32  brain]
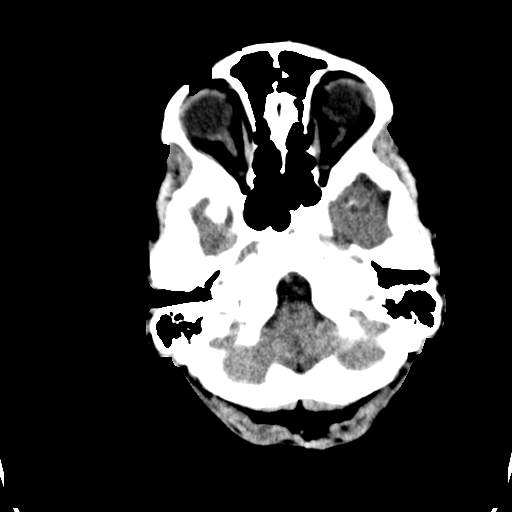
[im 6/32  brain]
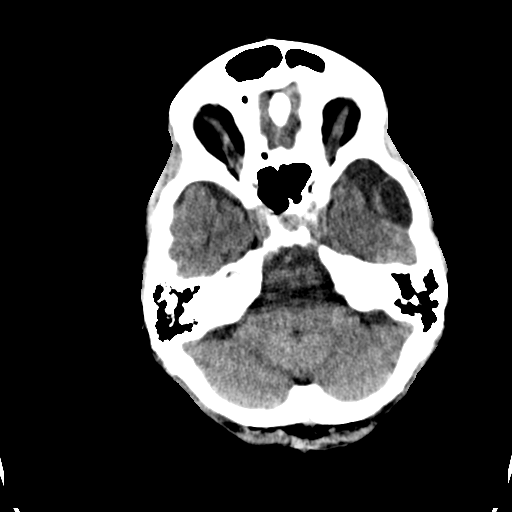
[im 8/32  brain]
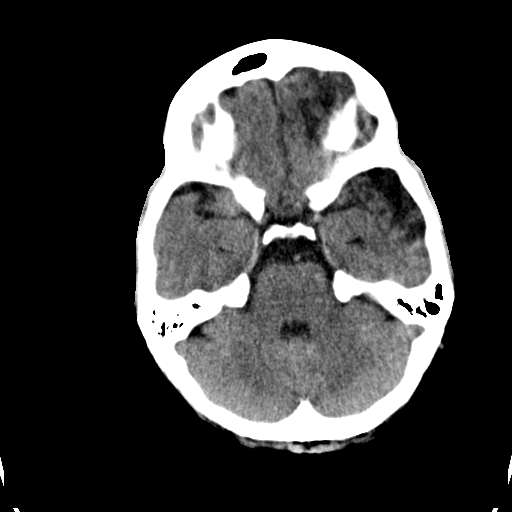
[im 9/32  brain]
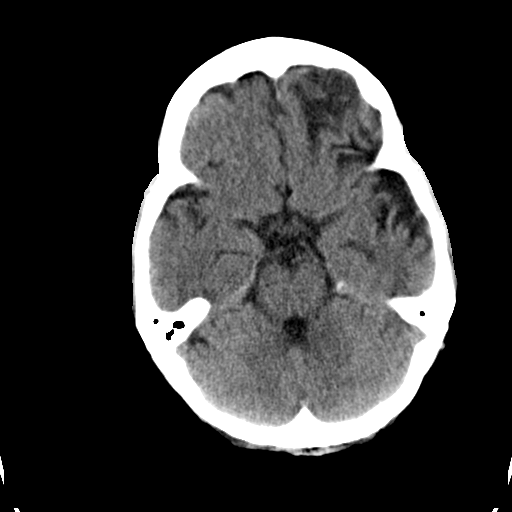
[im 9/32  bone]
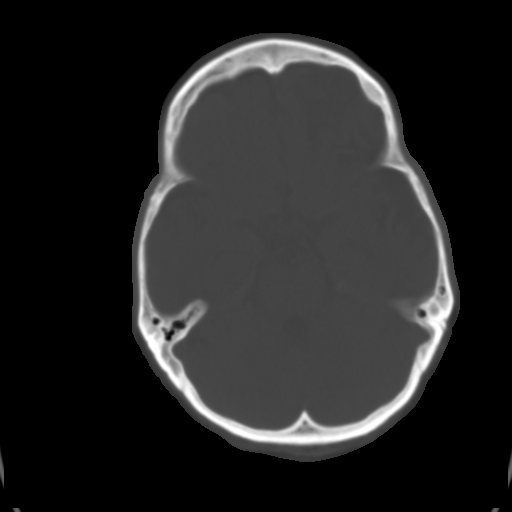
[im 11/32  brain]
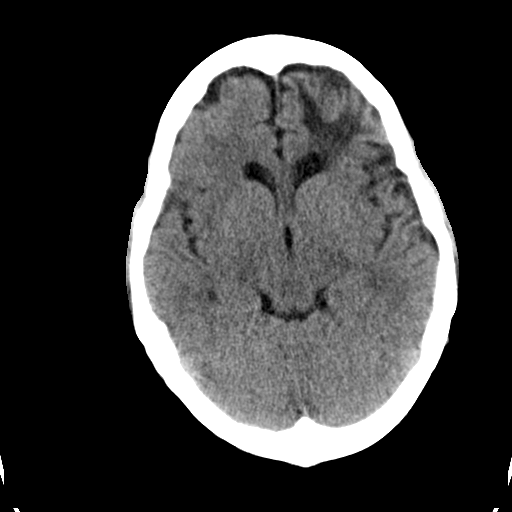
[im 13/32  brain]
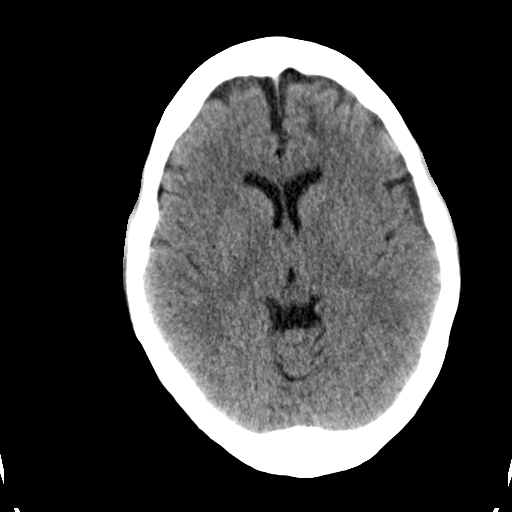
[im 15/32  brain]
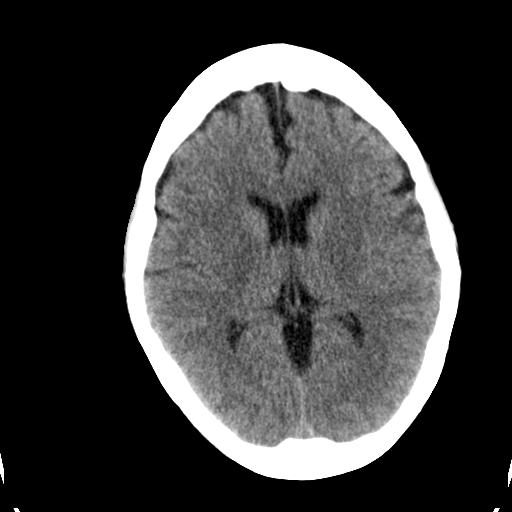
[im 17/32  brain]
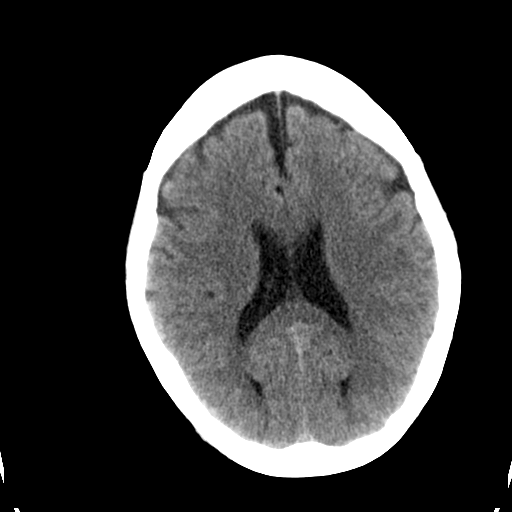
[im 17/32  bone]
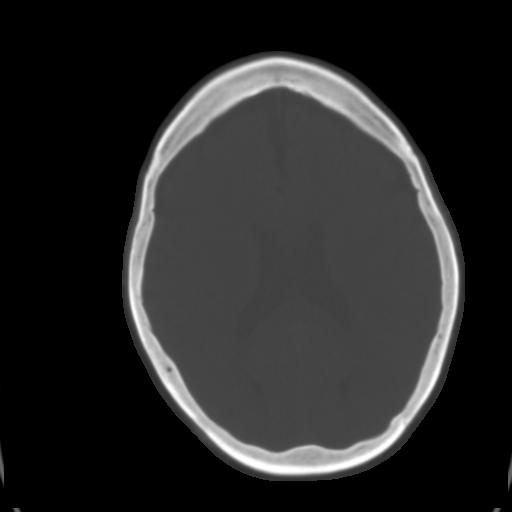
[im 19/32  brain]
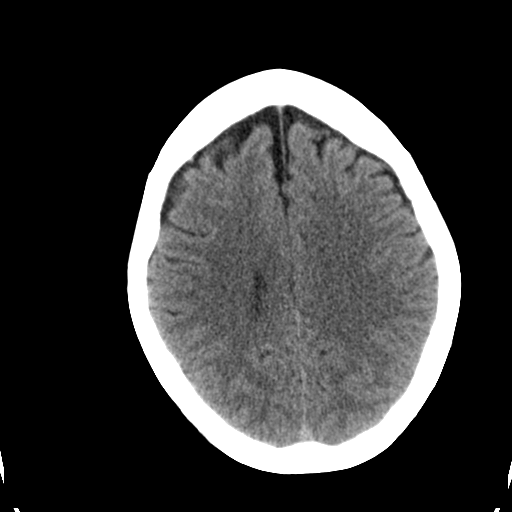
[im 21/32  brain]
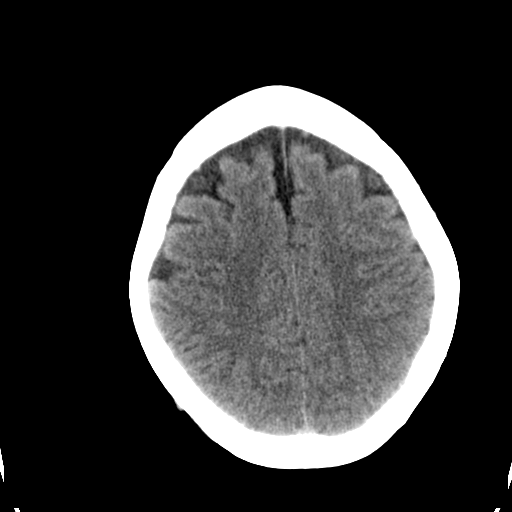
[im 23/32  brain]
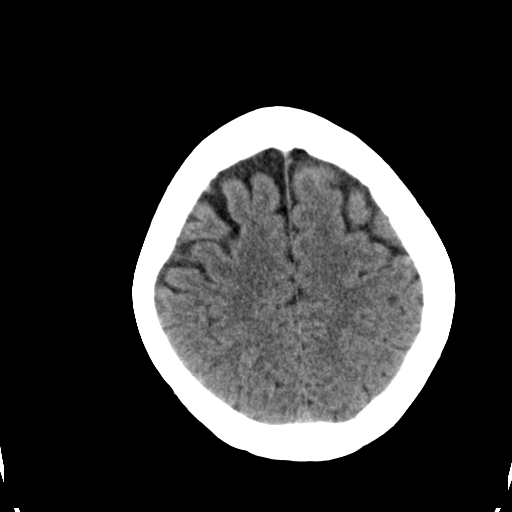
[im 24/32  brain]
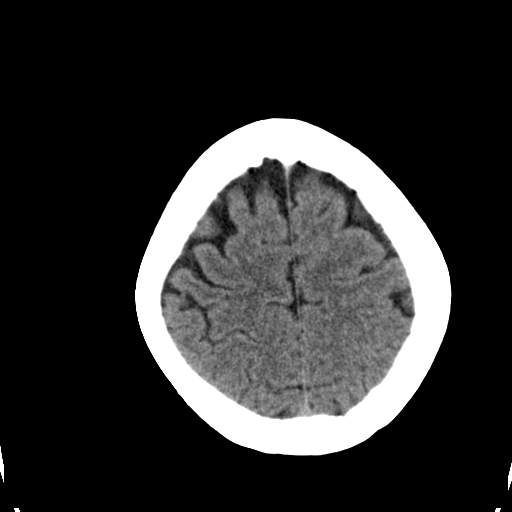
[im 24/32  bone]
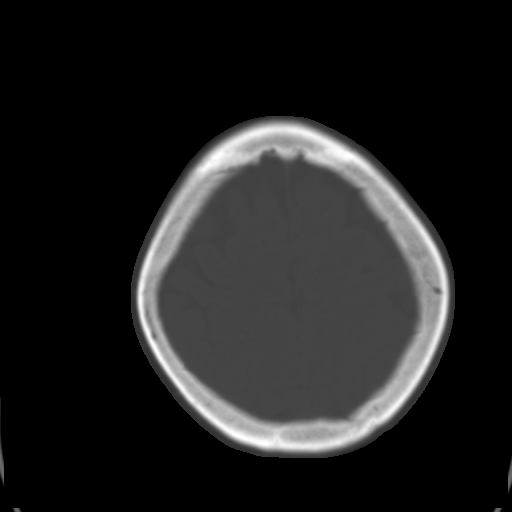
[im 26/32  brain]
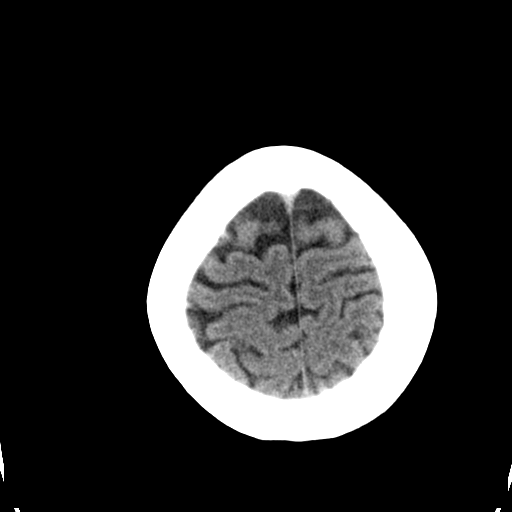
[im 28/32  brain]
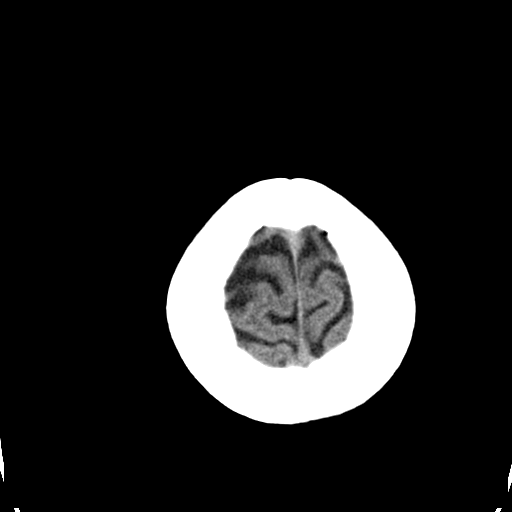
[im 30/32  brain]
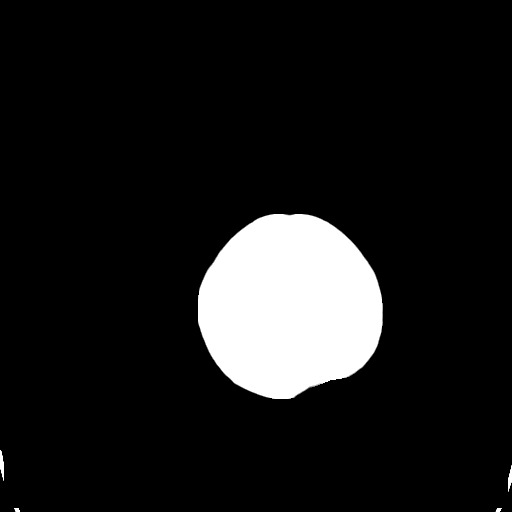

[16 of 30 positions shown; findings below may reference images not displayed]

FINDINGS: Encephalomalacia involving the left frontal lobe and left temporal
lobe. Ventricular system normal in size and appearance for age. No
mass lesion. No midline shift. No acute hemorrhage or hematoma. No
extra-axial fluid collections. No evidence of acute infarction.

No skull fracture or other focal osseous abnormality involving the
skull. Visualized paranasal sinuses, bilateral mastoid air cells and
bilateral middle ear cavities well-aerated.
IMPRESSION: 1. No acute intracranial abnormality.
2. Encephalomalacia involving the left frontal lobe and left
temporal lobe. Query sequela of prior trauma.

## 2017-09-06 DIAGNOSIS — Z79899 Other long term (current) drug therapy: Secondary | ICD-10-CM | POA: Diagnosis not present

## 2017-09-06 DIAGNOSIS — Z0000 Encounter for general adult medical examination without abnormal findings: Secondary | ICD-10-CM | POA: Diagnosis not present

## 2017-09-23 DIAGNOSIS — Z01419 Encounter for gynecological examination (general) (routine) without abnormal findings: Secondary | ICD-10-CM | POA: Diagnosis not present

## 2017-09-23 DIAGNOSIS — Z6841 Body Mass Index (BMI) 40.0 and over, adult: Secondary | ICD-10-CM | POA: Diagnosis not present

## 2017-10-28 DIAGNOSIS — M5416 Radiculopathy, lumbar region: Secondary | ICD-10-CM | POA: Diagnosis not present

## 2017-10-28 DIAGNOSIS — G44221 Chronic tension-type headache, intractable: Secondary | ICD-10-CM | POA: Diagnosis not present

## 2018-04-08 DIAGNOSIS — G44221 Chronic tension-type headache, intractable: Secondary | ICD-10-CM | POA: Diagnosis not present

## 2018-04-08 DIAGNOSIS — M797 Fibromyalgia: Secondary | ICD-10-CM | POA: Diagnosis not present

## 2018-04-08 DIAGNOSIS — E782 Mixed hyperlipidemia: Secondary | ICD-10-CM | POA: Diagnosis not present

## 2018-04-08 DIAGNOSIS — M5416 Radiculopathy, lumbar region: Secondary | ICD-10-CM | POA: Diagnosis not present

## 2018-10-20 DIAGNOSIS — Z0000 Encounter for general adult medical examination without abnormal findings: Secondary | ICD-10-CM | POA: Diagnosis not present

## 2018-10-20 DIAGNOSIS — M7551 Bursitis of right shoulder: Secondary | ICD-10-CM | POA: Diagnosis not present

## 2018-10-20 DIAGNOSIS — M5416 Radiculopathy, lumbar region: Secondary | ICD-10-CM | POA: Diagnosis not present

## 2019-03-06 DIAGNOSIS — M5416 Radiculopathy, lumbar region: Secondary | ICD-10-CM | POA: Diagnosis not present

## 2019-03-06 DIAGNOSIS — M797 Fibromyalgia: Secondary | ICD-10-CM | POA: Diagnosis not present

## 2019-03-06 DIAGNOSIS — M7581 Other shoulder lesions, right shoulder: Secondary | ICD-10-CM | POA: Diagnosis not present

## 2019-03-06 DIAGNOSIS — M25511 Pain in right shoulder: Secondary | ICD-10-CM | POA: Diagnosis not present

## 2019-03-06 DIAGNOSIS — E782 Mixed hyperlipidemia: Secondary | ICD-10-CM | POA: Diagnosis not present

## 2019-04-20 ENCOUNTER — Other Ambulatory Visit: Payer: Self-pay

## 2019-04-20 ENCOUNTER — Emergency Department: Payer: BC Managed Care – PPO

## 2019-04-20 ENCOUNTER — Emergency Department
Admission: EM | Admit: 2019-04-20 | Discharge: 2019-04-20 | Disposition: A | Discharge status: Home / Self Care | Payer: BC Managed Care – PPO | Attending: Emergency Medicine | Admitting: Emergency Medicine

## 2019-04-20 ENCOUNTER — Encounter: Payer: Self-pay | Admitting: *Deleted

## 2019-04-20 DIAGNOSIS — R4701 Aphasia: Secondary | ICD-10-CM | POA: Diagnosis not present

## 2019-04-20 DIAGNOSIS — G43109 Migraine with aura, not intractable, without status migrainosus: Secondary | ICD-10-CM | POA: Insufficient documentation

## 2019-04-20 DIAGNOSIS — R404 Transient alteration of awareness: Secondary | ICD-10-CM | POA: Insufficient documentation

## 2019-04-20 DIAGNOSIS — R479 Unspecified speech disturbances: Secondary | ICD-10-CM | POA: Diagnosis not present

## 2019-04-20 DIAGNOSIS — R531 Weakness: Secondary | ICD-10-CM | POA: Diagnosis not present

## 2019-04-20 DIAGNOSIS — R4182 Altered mental status, unspecified: Secondary | ICD-10-CM | POA: Diagnosis not present

## 2019-04-20 LAB — COMPREHENSIVE METABOLIC PANEL
ALT: 21 U/L (ref 0–44)
AST: 26 U/L (ref 15–41)
Albumin: 4.3 g/dL (ref 3.5–5.0)
Alkaline Phosphatase: 79 U/L (ref 38–126)
Anion gap: 13 (ref 5–15)
BUN: 15 mg/dL (ref 6–20)
CO2: 19 mmol/L — ABNORMAL LOW (ref 22–32)
Calcium: 9.3 mg/dL (ref 8.9–10.3)
Chloride: 105 mmol/L (ref 98–111)
Creatinine, Ser: 0.75 mg/dL (ref 0.44–1.00)
GFR calc Af Amer: >60 mL/min (ref >60)
GFR calc non Af Amer: >60 mL/min (ref >60)
Glucose, Bld: 97 mg/dL (ref 70–99)
Potassium: 3.9 mmol/L (ref 3.5–5.1)
Sodium: 137 mmol/L (ref 135–145)
Total Bilirubin: 0.6 mg/dL (ref 0.3–1.2)
Total Protein: 7.3 g/dL (ref 6.5–8.1)

## 2019-04-20 LAB — CBC
HCT: 40.1 % (ref 36.0–46.0)
Hemoglobin: 12.4 g/dL (ref 12.0–15.0)
MCH: 26.2 pg (ref 26.0–34.0)
MCHC: 30.9 g/dL (ref 30.0–36.0)
MCV: 84.6 fL (ref 80.0–100.0)
Platelets: 267 10*3/uL (ref 150–400)
RBC: 4.74 MIL/uL (ref 3.87–5.11)
RDW: 16.3 % — ABNORMAL HIGH (ref 11.5–15.5)
WBC: 8.1 10*3/uL (ref 4.0–10.5)
nRBC: 0 % (ref 0.0–0.2)

## 2019-04-20 LAB — GLUCOSE, CAPILLARY: Glucose-Capillary: 85 mg/dL (ref 70–99)

## 2019-04-20 LAB — TROPONIN I (HIGH SENSITIVITY): Troponin I (High Sensitivity): <2 ng/L (ref <18)

## 2019-04-20 MED ORDER — LORAZEPAM 1 MG PO TABS
1.0000 mg | ORAL_TABLET | Freq: Once | ORAL | Status: AC
  Administered 2019-04-20: 1 mg via ORAL
  Filled 2019-04-20: qty 1

## 2019-04-20 MED ORDER — LORAZEPAM 1 MG PO TABS: 1.0000 mg | ORAL_TABLET | Freq: Three times a day (TID) | ORAL | 0 refills | Status: DC | PRN

## 2019-04-20 NOTE — Discharge Instructions (Addendum)
Given that some of your symptoms could be related to seizures, I would recommend taking the Ativan 1 mg tablet tonight if needed, and 0.5 mg or half a tablet in the morning.  This can be taken up to 3 times a day for seizures.  This could make you somewhat drowsy, as it is related to your Klonopin, so be careful.  Do not drink alcohol while taking these.  Follow-up with Dr. Krista Blue in the morning as we discussed.

## 2019-04-20 NOTE — ED Notes (Signed)
Pt A&Ox4. Figgity, jittery, talkative.

## 2019-04-20 NOTE — ED Provider Notes (Signed)
[facility] Emergency Department Provider Note  ____________________________________________   First MD Initiated Contact with Patient 04/20/19 1826     (approximate)  I have reviewed the triage vital signs and the nursing notes.   HISTORY  Chief Complaint Altered Mental Status    HPI Lorraine Hurst is a 53 y.o. female with extensive past medical history as below here with reported altered mental status.  Patient has a well-documented, extensive history of reported seizures and complex migraines involving speech difficulty.  She woke up today with a migraine.  She states that she is woken up essentially every day for the last month with migraine.  She states that her headache today was her usual severity, and that when they were this severe, she does get some difficulty expressing herself.  She went to a PCP appointment, hoping to be seen, and was sent here for further evaluation due to expressive aphasia.  She also had some intermittent twitching of her right arm.  She is adamant that all the symptoms are constant, and near daily.  She is now back to her baseline.  She has been worked up extensively for this and actually has an appointment with her neurologist tomorrow.  The only different thing was that her blood pressure was elevated, and she states that she has had some partial seizures that present this way as well.  She has not missed any of her medications.  No recent fevers or chills.  No recent trauma.  No neck pain or neck stiffness.  She is back to her baseline now.        Past Medical History:  Diagnosis Date  . Acute DVT (deep venous thrombosis) ([place])   . Arthritis   . Chronic back pain   . Hyperlipemia   . Migraine   . Murmur   . Seizures ([place])     There are no active problems to display for this patient.     Prior to Admission medications   Medication Sig Start Date End Date Taking? Authorizing Provider  LORazepam (ATIVAN) 1 MG  tablet Take 1 tablet (1 mg total) by mouth every 8 (eight) hours as needed for up to 3 days for seizure or sleep. 04/20/19 04/23/19  Duffy Bruce, MD    Allergies Patient has no known allergies.  No family history on file.  Social History Social History   Tobacco Use  . Smoking status: Never Smoker  . Smokeless tobacco: Never Used  Substance Use Topics  . Alcohol use: No  . Drug use: Not on file    Review of Systems  Review of Systems  Constitutional: Positive for fatigue. Negative for fever.  HENT: Negative for congestion and sore throat.   Eyes: Negative for visual disturbance.  Respiratory: Negative for cough and shortness of breath.   Cardiovascular: Negative for chest pain.  Gastrointestinal: Negative for abdominal pain, diarrhea, nausea and vomiting.  Genitourinary: Negative for flank pain.  Musculoskeletal: Negative for back pain and neck pain.  Skin: Negative for rash and wound.  Neurological: Positive for seizures, weakness and headaches.  All other systems reviewed and are negative.    ____________________________________________  PHYSICAL EXAM:      VITAL SIGNS: ED Triage Vitals  Enc Vitals Group     BP 04/20/19 1538 (!) 143/71     Pulse Rate 04/20/19 1538 100     Resp 04/20/19 1538 18     Temp 04/20/19 1538 98.2 F (36.8 C)  Temp Source 04/20/19 1538 Oral     SpO2 04/20/19 1538 98 %     Weight --      Height 04/20/19 1539 5\' 2"  (1.575 m)     Head Circumference --      Peak Flow --      Pain Score 04/20/19 1543 10     Pain Loc --      Pain Edu? --      Excl. in GC? --      Physical Exam    ____________________________________________   LABS (all labs ordered are listed, but only abnormal results are displayed)  Labs Reviewed  COMPREHENSIVE METABOLIC PANEL - Abnormal; Notable for the following components:      Result Value   CO2 19 (*)    All other components within normal limits  CBC - Abnormal; Notable for the following  components:   RDW 16.3 (*)    All other components within normal limits  GLUCOSE, CAPILLARY  CBG MONITORING, ED  TROPONIN I (HIGH SENSITIVITY)  TROPONIN I (HIGH SENSITIVITY)    ____________________________________________  EKG: As below. ________________________________________  RADIOLOGY All imaging, including plain films, CT scans, and ultrasounds, independently reviewed by me, and interpretations confirmed via formal radiology reads.  ED MD interpretation:   See ED course for personal interpretations.  CT Head: NAICA  Official radiology report(s): Ct Head Wo Contrast  Result Date: 04/20/2019 CLINICAL DATA:  Difficulty speaking EXAM: CT HEAD WITHOUT CONTRAST TECHNIQUE: Contiguous axial images were obtained from the base of the skull through the vertex without intravenous contrast. COMPARISON:  CT brain 05/23/2015 FINDINGS: Brain: No acute territorial infarction, hemorrhage, or intracranial mass. Encephalomalacia in the left frontal and temporal lobes as before. Mild ex vacuo dilatation of the left frontal horn. Vascular: No hyperdense vessel.  No unexpected calcification Skull: Normal. Negative for fracture or focal lesion. Sinuses/Orbits: No acute finding. Other: None IMPRESSION: 1. No CT evidence for acute intracranial abnormality. 2. Encephalomalacia in the left frontal and temporal lobes. Electronically Signed   By: Jasmine PangKim  Fujinaga M.D.   On: 04/20/2019 16:21    ____________________________________________  PROCEDURES   Procedure(s) performed (including Critical Care):  Procedures  ____________________________________________  INITIAL IMPRESSION / MDM / ASSESSMENT AND PLAN / ED COURSE  As part of my medical decision making, I reviewed the following data within the electronic MEDICAL RECORD NUMBER Notes from prior ED visits and [facility] Controlled Substance Database      *Lorraine Hurst was evaluated in Emergency Department on 04/20/2019 for the symptoms described in the  history of present illness. She was evaluated in the context of the global COVID-19 pandemic, which necessitated consideration that the patient might be at risk for infection with the SARS-CoV-2 virus that causes COVID-19. Institutional protocols and algorithms that pertain to the evaluation of patients at risk for COVID-19 are in a state of rapid change based on information released by regulatory bodies including the CDC and federal and state organizations. These policies and algorithms were followed during the patient's care in the ED.  Some ED evaluations and interventions may be delayed as a result of limited staffing during the pandemic.*   Clinical Course as of Apr 19 1949  Mon Apr 20, 2019  [id number] 53 year old female here with transient slurred speech and headache.  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  She has similar headache and slurred speech nearly daily for the last month and has an appointment with  her neurologist tomorrow.  She was sent here due to PCPs concern, the patient states this is not necessarily abnormal for her.  Her symptoms are now completely resolved.  CT scan is negative.  Neurological exam now is nonfocal with no focal deficits.  She feels comfortable with plan to follow-up with her neurologist, which I think is reasonable.  She has no new evidence to suggest new CVA.  She did report some cramping of her right arm, and has a history of partial seizures, so will give her a very low-dose of Ativan to cover her until she can see her neurologist tomorrow.  She will otherwise continue her medications as prescribed.  She declines migraine cocktail or steroids.  No fever, chills, or signs of meningitis or encephalitis.  Headache is not consistent with subarachnoid.   [CI]    Clinical Course User Index [CI] Shaune Pollack, MD    Medical Decision Making:  As above.    ____________________________________________  FINAL CLINICAL IMPRESSION(S) / ED DIAGNOSES  Final diagnoses:  Transient alteration of awareness  Complicated migraine     MEDICATIONS GIVEN DURING THIS VISIT:  Medications  LORazepam (ATIVAN) tablet 1 mg (1 mg Oral Given 04/20/19 1903)     ED Discharge Orders         Ordered    LORazepam (ATIVAN) 1 MG tablet  Every 8 hours PRN     04/20/19 1918           Note:  This document was prepared using Dragon voice recognition software and may include unintentional dictation errors.   Shaune Pollack, MD 04/20/19 [phone]

## 2019-04-20 NOTE — ED Triage Notes (Signed)
Pt to triage via wheelchair.  Pt sent from [facility] for eval of diff speaking.  Hx brain trauma.  Sx worse today.  Sx began this am.  Pt alert.

## 2019-04-20 NOTE — ED Notes (Signed)
Verbal no 2nd trop needed per EDP.

## 2019-04-21 ENCOUNTER — Encounter: Payer: Self-pay | Admitting: Neurology

## 2019-04-21 ENCOUNTER — Other Ambulatory Visit: Payer: Self-pay

## 2019-04-21 ENCOUNTER — Ambulatory Visit: Payer: BC Managed Care – PPO | Admitting: Neurology

## 2019-04-21 VITALS — BP 116/74 | HR 94 | Temp 98.2°F | Ht 62.0 in | Wt 377.5 lb

## 2019-04-21 DIAGNOSIS — G40909 Epilepsy, unspecified, not intractable, without status epilepticus: Secondary | ICD-10-CM | POA: Diagnosis not present

## 2019-04-21 MED ORDER — RIZATRIPTAN BENZOATE 10 MG PO TBDP: 10.0000 mg | ORAL_TABLET | ORAL | 6 refills | Status: DC | PRN

## 2019-04-21 MED ORDER — TOPIRAMATE 50 MG PO TABS: 100.0000 mg | ORAL_TABLET | Freq: Two times a day (BID) | ORAL | 11 refills | Status: DC

## 2019-04-21 MED ORDER — LAMOTRIGINE 100 MG PO TABS: ORAL_TABLET | ORAL | 11 refills | Status: DC
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
## 2019-04-22 ENCOUNTER — Other Ambulatory Visit: Payer: Self-pay | Admitting: *Deleted

## 2019-04-22 MED ORDER — TOPIRAMATE 50 MG PO TABS: 100.0000 mg | ORAL_TABLET | Freq: Two times a day (BID) | ORAL | 11 refills | Status: DC

## 2019-05-06 ENCOUNTER — Other Ambulatory Visit: Payer: Self-pay

## 2019-05-06 ENCOUNTER — Ambulatory Visit: Payer: BC Managed Care – PPO | Admitting: Neurology

## 2019-05-06 DIAGNOSIS — G40909 Epilepsy, unspecified, not intractable, without status epilepticus: Secondary | ICD-10-CM | POA: Diagnosis not present

## 2019-05-07 NOTE — Procedures (Signed)
   HISTORY: 53 years old female, had frequent seizure since left craniotomy in [place]:  This is a routine 16 channel EEG recording with one channel devoted to a limited EKG recording.  It was performed during wakefulness, drowsiness and asleep.  Hyperventilation and photic stimulation were performed as activating procedures.  There are minimum muscle and movement artifact noted.  Upon maximum arousal, posterior dominant waking rhythm consistent of rhythmic alpha range activity, with frequency of 10 hz. Activities are symmetric over the bilateral posterior derivations and attenuated with eye opening.  Hyperventilation produced mild/moderate buildup with higher amplitude and the slower activities noted.  Photic stimulation did not alter the tracing.  During EEG recording, patient developed drowsiness and no deeper stage of sleep was achieved  During EEG recording, there was no epileptiform discharge noted.  EKG demonstrate sinus rhythm, with heart rate of 90 bpm  CONCLUSION: This is a  normal awake EEG.  There is no electrodiagnostic evidence of epileptiform discharge.  Marcial Pacas, M.D. Ph.D.  [facility] [place], [place] [address] Phone: [phone] Fax:      [phone]

## 2019-05-11 ENCOUNTER — Telehealth: Payer: Self-pay | Admitting: *Deleted

## 2019-05-11 NOTE — Telephone Encounter (Signed)
I left message letting her know the EEG results are normal.  She should continue to take her medications as prescribed and keep her pending follow up.  Provided our number to call back with any questions.

## 2019-05-11 NOTE — Telephone Encounter (Signed)
-----   Message from Marcial Pacas, MD sent at 05/11/2019  8:49 AM EDT ----- Please call patient for normal EEG

## 2019-07-26 NOTE — Progress Notes (Deleted)
PATIENT: Konya Fauble Neeb DOB: [birth year]  REASON FOR VISIT: follow up HISTORY FROM: patient  HISTORY OF PRESENT ILLNESS: Today 07/26/19  HISTORY  NOVIE MAGGIO is a 54 year old female, seen in request by primary care physician Dr. Bethann Punches for evaluation of chronic migraine headaches, and seizure, initial evaluation was on April 21, 2019.  I have reviewed and summarized the referring note from the referring physician.  She had a history of chronic migraine headaches, clotting disorders, asthma, she had a history of right lower extremity DVT, had IVC filter placement, lap band procedure in 2009, also had a history of subdural hematoma evacuation via craniotomy in 1983, she also had a history of chronic low back pain, gait abnormality.  She began to have frequent seizure since left craniotomy in 1983, usually preceded by word finding difficulties, confusion, right arm jerking, occasionally wanting to generalized tonic-clonic seizure, patient is a poor historian, multiple complaints today, diffuse body achy pain, gait abnormality, has not been feeling well since 2020.  She also complains of daily headaches, left retro-orbital area moderate to severe headaches, Imitrex as needed helps her some, but not totally eliminate her headaches.   Most recent recurrent seizure was on April 20, 2019, while taking Topamax 50 mg twice a day, lamotrigine 100 mg twice a day,  I personally reviewed CT head without contrast on April 20, 2019, encephalomalacia at left frontal and temporal lobes.  Laboratory evaluations showed normal CMP, CBC, with RDW of 16.3, negative troponin.  Update July 27, 2019 SS: After last visit, lamotrigine was increased to 100 mg in the morning, 200 mg in the evening, Topamax 100 mg twice a day.  She was switched from Imitrex to Maxalt.  EEG 05/06/2019 was normal.  REVIEW OF SYSTEMS: Out of a complete 14 system review of symptoms, the patient complains  only of the following symptoms, and all other reviewed systems are negative.  ALLERGIES: No Known Allergies  HOME MEDICATIONS: Outpatient Medications Prior to Visit  Medication Sig Dispense Refill  . Cholecalciferol (D2000 ULTRA STRENGTH) 50 MCG (2000 UT) CAPS Take 2,000 Units by mouth daily.     . clonazePAM (KLONOPIN) 1 MG tablet Take 1 mg by mouth 2 (two) times daily.    . diclofenac sodium (VOLTAREN) 1 % GEL Apply 2 g topically daily as needed.    . Diclofenac-miSOPROStol 75-0.2 MG TBEC Take 1 tablet by mouth 2 (two) times daily.     . DULoxetine (CYMBALTA) 30 MG capsule Take 30 mg by mouth at bedtime.     . DULoxetine (CYMBALTA) 60 MG capsule Take 60 mg by mouth every morning.     . ferrous sulfate 325 (65 FE) MG tablet Take 325 mg by mouth daily.     Marland Kitchen lamoTRIgine (LAMICTAL) 100 MG tablet One in am, 2 tablets at night 90 tablet 11  . magnesium oxide (MAG-OX) 400 MG tablet Take 400 mg by mouth daily.     . Multiple Vitamin (MULTI-VITAMIN) tablet Take 1 tablet by mouth daily.     Marland Kitchen NABUMETONE PO Take 750 mg by mouth 2 (two) times daily as needed.    . Omega-3 Fatty Acids (FISH OIL PO) Take 1 tablet by mouth daily.     . ondansetron (ZOFRAN) 4 MG tablet Take 4 mg by mouth every 8 (eight) hours as needed for nausea or vomiting.    . pantoprazole (PROTONIX) 40 MG tablet Take 40 mg by mouth daily.     . potassium chloride SA (  K-DUR) 20 MEQ tablet Take 20 mEq by mouth daily.     . rizatriptan (MAXALT-MLT) 10 MG disintegrating tablet Take 1 tablet (10 mg total) by mouth as needed. May repeat in 2 hours if needed 15 tablet 6  . tiZANidine (ZANAFLEX) 4 MG tablet TAKE ONE TABLET BY MOUTH TWICE DAILY AS NEEDED    . topiramate (TOPAMAX) 50 MG tablet Take 2 tablets (100 mg total) by mouth 2 (two) times daily. 120 tablet 11  . vitamin B-12 (CYANOCOBALAMIN) 1000 MCG tablet Take 5,000 mcg by mouth daily.     Marland Kitchen VITAMIN E PO Take 400 Units by mouth daily.      No facility-administered medications  prior to visit.    PAST MEDICAL HISTORY: Past Medical History:  Diagnosis Date  . Acute DVT (deep venous thrombosis) ([place])   . Anemia   . Arthritis   . Chronic back pain   . Chronic nausea   . Clotting disorder ([place])   . Fibromyalgia   . History of DVT (deep vein thrombosis)    right leg  . Hyperlipemia   . Migraine   . Murmur   . Right shoulder pain   . Seizures ([place])     PAST SURGICAL HISTORY: Past Surgical History:  Procedure Laterality Date  . IVC FILTER INSERTION    . LAPAROSCOPIC GASTRIC BANDING    . LEG SURGERY    . SUBDURAL HEMATOMA EVACUATION VIA CRANIOTOMY     age 75    FAMILY HISTORY: Family History  Problem Relation Age of Onset  . Healthy Mother   . Leukemia Father        died at age 108    SOCIAL HISTORY: Social History   Socioeconomic History  . Marital status: Married    Spouse name: Not on file  . Number of children: 0  . Years of education: 16  . Highest education level: High school graduate  Occupational History  . Occupation: Unemployed for six years  Tobacco Use  . Smoking status: Never Smoker  . Smokeless tobacco: Never Used  Substance and Sexual Activity  . Alcohol use: No  . Drug use: Never  . Sexual activity: Not on file  Other Topics Concern  . Not on file  Social History Narrative   Lives at home with her husband.   Ambidextrous.   No daily caffeine per day.     Social Determinants of Health   Financial Resource Strain:   . Difficulty of Paying Living Expenses: Not on file  Food Insecurity:   . Worried About Charity fundraiser in the Last Year: Not on file  . Ran Out of Food in the Last Year: Not on file  Transportation Needs:   . Lack of Transportation (Medical): Not on file  . Lack of Transportation (Non-Medical): Not on file  Physical Activity:   . Days of Exercise per Week: Not on file  . Minutes of Exercise per Session: Not on file  Stress:   . Feeling of Stress : Not on file  Social Connections:   .  Frequency of Communication with Friends and Family: Not on file  . Frequency of Social Gatherings with Friends and Family: Not on file  . Attends Religious Services: Not on file  . Active Member of Clubs or Organizations: Not on file  . Attends Archivist Meetings: Not on file  . Marital Status: Not on file  Intimate Partner Violence:   . Fear of Current or Ex-Partner:  Not on file  . Emotionally Abused: Not on file  . Physically Abused: Not on file  . Sexually Abused: Not on file      PHYSICAL EXAM  There were no vitals filed for this visit. There is no height or weight on file to calculate BMI.  Generalized: Well developed, in no acute distress   Neurological examination  Mentation: Alert oriented to time, place, history taking. Follows all commands speech and language fluent Cranial nerve II-XII: Pupils were equal round reactive to light. Extraocular movements were full, visual field were full on confrontational test. Facial sensation and strength were normal. Uvula tongue midline. Head turning and shoulder shrug  were normal and symmetric. Motor: The motor testing reveals 5 over 5 strength of all 4 extremities. Good symmetric motor tone is noted throughout.  Sensory: Sensory testing is intact to soft touch on all 4 extremities. No evidence of extinction is noted.  Coordination: Cerebellar testing reveals good finger-nose-finger and heel-to-shin bilaterally.  Gait and station: Gait is normal. Tandem gait is normal. Romberg is negative. No drift is seen.  Reflexes: Deep tendon reflexes are symmetric and normal bilaterally.   DIAGNOSTIC DATA (LABS, IMAGING, TESTING) - I reviewed patient records, labs, notes, testing and imaging myself where available.  Lab Results  Component Value Date   WBC 8.1 04/20/2019   HGB 12.4 04/20/2019   HCT 40.1 04/20/2019   MCV 84.6 04/20/2019   PLT 267 04/20/2019      Component Value Date/Time   NA 137 04/20/2019 1550   NA 144  07/26/2011 0527   K 3.9 04/20/2019 1550   K 4.2 10/02/2011 1208   CL 105 04/20/2019 1550   CL 109 (H) 07/26/2011 0527   CO2 19 (L) 04/20/2019 1550   CO2 27 07/26/2011 0527   GLUCOSE 97 04/20/2019 1550   GLUCOSE 77 07/26/2011 0527   BUN 15 04/20/2019 1550   BUN 6 (L) 07/26/2011 0527   CREATININE 0.75 04/20/2019 1550   CREATININE 0.64 07/26/2011 0527   CALCIUM 9.3 04/20/2019 1550   CALCIUM 8.6 07/26/2011 0527   PROT 7.3 04/20/2019 1550   ALBUMIN 4.3 04/20/2019 1550   AST 26 04/20/2019 1550   ALT 21 04/20/2019 1550   ALKPHOS 79 04/20/2019 1550   BILITOT 0.6 04/20/2019 1550   GFRNONAA >60 04/20/2019 1550   GFRNONAA >60 07/26/2011 0527   GFRAA >60 04/20/2019 1550   GFRAA >60 07/26/2011 0527   Lab Results  Component Value Date   CHOL  11/01/2007    168        ATP III CLASSIFICATION:  <200     mg/dL   Desirable  578-469  mg/dL   Borderline High  >=629    mg/dL   High   TRIG 528 41/32/4401   No results found for: HGBA1C No results found for: VITAMINB12 No results found for: TSH    ASSESSMENT AND PLAN 54 y.o. year old female  has a past medical history of Acute DVT (deep venous thrombosis) (HCC), Anemia, Arthritis, Chronic back pain, Chronic nausea, Clotting disorder (HCC), Fibromyalgia, History of DVT (deep vein thrombosis), Hyperlipemia, Migraine, Murmur, Right shoulder pain, and Seizures (HCC). here with ***   I spent 15 minutes with the patient. 50% of this time was spent   Margie Ege, Benjamin, DNP 07/26/2019, 3:43 PM [facility] [address] [place], [place] [address] [phone]

## 2019-07-27 ENCOUNTER — Encounter: Payer: Self-pay | Admitting: Neurology

## 2019-07-27 ENCOUNTER — Telehealth: Payer: Self-pay

## 2019-07-27 ENCOUNTER — Ambulatory Visit: Payer: BC Managed Care – PPO | Admitting: Neurology

## 2019-07-27 NOTE — Telephone Encounter (Signed)
Patient was a no call/no show for their appointment today.   

## 2019-08-10 ENCOUNTER — Encounter: Payer: Self-pay | Admitting: Neurology

## 2019-08-10 ENCOUNTER — Other Ambulatory Visit: Payer: Self-pay

## 2019-08-10 ENCOUNTER — Ambulatory Visit: Payer: BC Managed Care – PPO | Admitting: Neurology

## 2019-08-10 VITALS — BP 141/92 | HR 90 | Temp 97.2°F | Ht 62.0 in | Wt 372.8 lb

## 2019-08-10 DIAGNOSIS — G43911 Migraine, unspecified, intractable, with status migrainosus: Secondary | ICD-10-CM

## 2019-08-10 DIAGNOSIS — IMO0002 Reserved for concepts with insufficient information to code with codable children: Secondary | ICD-10-CM | POA: Insufficient documentation

## 2019-08-10 DIAGNOSIS — G40909 Epilepsy, unspecified, not intractable, without status epilepticus: Secondary | ICD-10-CM

## 2019-08-10 DIAGNOSIS — G43909 Migraine, unspecified, not intractable, without status migrainosus: Secondary | ICD-10-CM | POA: Insufficient documentation

## 2019-08-10 MED ORDER — AIMOVIG 70 MG/ML ~~LOC~~ SOAJ: 70.0000 mg | SUBCUTANEOUS | 5 refills | Status: DC
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
## 2019-08-10 NOTE — Patient Instructions (Signed)
Continue current medications, I will add Aimovig 70 mg monthly injection for headache prevention   I will check blood work today  Return in 3 months

## 2019-08-12 LAB — TOPIRAMATE LEVEL: Topiramate Lvl: 3.3 ug/mL (ref 2.0–25.0)

## 2019-08-12 LAB — LAMOTRIGINE LEVEL: Lamotrigine Lvl: 3 ug/mL (ref 2.0–20.0)

## 2019-08-13 ENCOUNTER — Telehealth: Payer: Self-pay

## 2019-08-13 NOTE — Telephone Encounter (Signed)
Started a PA for pts med Aimovig 70mg /mL.  Via CMM.    Received approval.  Effective from 08/13/19- 11/10/19.

## 2019-08-13 NOTE — Telephone Encounter (Signed)
Called pt on behalf of provider request NP Margie Ege. Pt demonstrated understanding and had no further questions.   "Please call the patient, labs are within normal range for Lamictal and topamax. No change in dosing." -NP SS

## 2019-08-17 NOTE — Telephone Encounter (Addendum)
Called [facility] pharmacy, spoke with Okey Regal and advised her Aimovig was approved until 11/10/19. She stated she will order it in tomorrow; I advised I'll let patient know. She verbalized understanding, appreciation. Spoke with patient and informed her. Patient verbalized understanding, appreciation.

## 2019-08-17 NOTE — Telephone Encounter (Signed)
Patient called to check on the status of the aimovig PA states she was advised by the pharmacy to FU with our office due to no approval recieved  Please follow up

## 2019-09-23 NOTE — Progress Notes (Signed)
I have reviewed and agreed above plan. 

## 2019-11-10 ENCOUNTER — Telehealth: Payer: Self-pay | Admitting: *Deleted

## 2019-11-10 ENCOUNTER — Ambulatory Visit: Payer: BC Managed Care – PPO | Admitting: Neurology
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
## 2019-11-17 ENCOUNTER — Telehealth: Payer: Self-pay

## 2019-11-17 NOTE — Telephone Encounter (Signed)
Received a PA request for aimovig. Started PA on covermymeds. Key: [id number].  One question is "Has the patient experienced a decreased number of migraine days or frequency?"  I called pt to discuss. She is also overdue for an appt with Maralyn Sago, NP and I would offer to schedule that for her. No answer, left a message asking her to call me back.

## 2019-11-19 NOTE — Telephone Encounter (Signed)
Attempted to call pt, as not heard back.  Could not connect, silence when attempted 4 times.  Will try later.

## 2019-12-22 ENCOUNTER — Telehealth: Payer: Self-pay

## 2019-12-22 NOTE — Telephone Encounter (Signed)
error 

## 2019-12-22 NOTE — Telephone Encounter (Addendum)
Attemted to call the patient 2x. LM on the VM for the patient to call back.

## 2019-12-22 NOTE — Telephone Encounter (Signed)
Pt called back. Pt said she has been having worsening headaches "every single day" since she ran out of medication. Pt said she has not taken Aimovig since April 2021. Pt does have a f/u with Dr. [name] on 12/25/19. PA can be attempted after her f/u visit with Dr. [name].

## 2019-12-24 ENCOUNTER — Encounter: Payer: Self-pay | Admitting: *Deleted

## 2019-12-24 ENCOUNTER — Encounter: Payer: Self-pay | Admitting: Neurology

## 2019-12-24 ENCOUNTER — Other Ambulatory Visit: Payer: Self-pay

## 2019-12-24 ENCOUNTER — Telehealth: Payer: Self-pay | Admitting: Neurology

## 2019-12-24 ENCOUNTER — Ambulatory Visit: Payer: BC Managed Care – PPO | Admitting: Neurology

## 2019-12-24 VITALS — BP 129/79 | HR 87 | Ht 62.0 in | Wt 355.5 lb

## 2019-12-24 DIAGNOSIS — Z9889 Other specified postprocedural states: Secondary | ICD-10-CM | POA: Diagnosis not present

## 2019-12-24 DIAGNOSIS — G43911 Migraine, unspecified, intractable, with status migrainosus: Secondary | ICD-10-CM | POA: Diagnosis not present

## 2019-12-24 DIAGNOSIS — G40209 Localization-related (focal) (partial) symptomatic epilepsy and epileptic syndromes with complex partial seizures, not intractable, without status epilepticus: Secondary | ICD-10-CM | POA: Diagnosis not present

## 2019-12-24 MED ORDER — AIMOVIG 70 MG/ML ~~LOC~~ SOAJ: 70.0000 mg | SUBCUTANEOUS | 11 refills | Status: DC

## 2019-12-24 MED ORDER — TOPIRAMATE 100 MG PO TABS: 100.0000 mg | ORAL_TABLET | Freq: Two times a day (BID) | ORAL | 11 refills | Status: DC

## 2019-12-24 MED ORDER — RIZATRIPTAN BENZOATE 10 MG PO TBDP: 10.0000 mg | ORAL_TABLET | ORAL | 6 refills | Status: DC | PRN

## 2019-12-24 MED ORDER — LAMOTRIGINE 200 MG PO TABS: 200.0000 mg | ORAL_TABLET | Freq: Two times a day (BID) | ORAL | 11 refills | Status: DC
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
## 2019-12-24 NOTE — Telephone Encounter (Signed)
Received Botox charge sheet and patient consent signature. Beginning Botox PA. I called patient and got her scheduled for 6/16 for her first Botox appointment.

## 2019-12-24 NOTE — Telephone Encounter (Signed)
I completed PA request and sent to [facility]  via CoverMyMeds. Awaiting response.

## 2019-12-28 NOTE — Telephone Encounter (Signed)
The patient has a Insurance underwriter. They will not allow simultaneous use of a CGRP and Botox.   I have left her a message requesting a return call to discuss this matter.

## 2019-12-28 NOTE — Telephone Encounter (Signed)
I have spoken to the patient. She would rather proceed with Botox. PA request from Aimovig has been voided.

## 2019-12-28 NOTE — Telephone Encounter (Signed)
MRI of brain was ordred

## 2019-12-28 NOTE — Telephone Encounter (Signed)
Now that patient is not using the Aimovig, I filled out new PA request and faxed to [facility] with MD signature. Awaiting response.

## 2019-12-28 NOTE — Addendum Note (Signed)
Addended by: [name] on: 12/28/2019 11:47 AM   Modules accepted: Orders

## 2019-12-28 NOTE — Telephone Encounter (Signed)
Pt returned call. Please call pt back when available.  

## 2019-12-28 NOTE — Addendum Note (Signed)
Addended by: Lindell Spar C on: 12/28/2019 11:21 AM   Modules accepted: Orders

## 2019-12-31 ENCOUNTER — Telehealth: Payer: Self-pay | Admitting: Neurology

## 2019-12-31 NOTE — Telephone Encounter (Signed)
BCBS Auth: [id number] (exp. 12/31/19 to 06/27/20) order sent to GI. They will reach out to the patient to schedule.

## 2019-12-31 NOTE — Telephone Encounter (Signed)
Received approval for Botox from [employer]. PA #[id number] valid 12/24/19 to 06/23/20. Patient B/B for 01/06/20 appt

## 2020-01-06 ENCOUNTER — Encounter: Payer: Self-pay | Admitting: Neurology

## 2020-01-06 ENCOUNTER — Ambulatory Visit: Payer: BC Managed Care – PPO | Admitting: Neurology

## 2020-01-06 ENCOUNTER — Other Ambulatory Visit: Payer: Self-pay

## 2020-01-06 VITALS — BP 137/87 | HR 98 | Ht 62.0 in | Wt 350.5 lb

## 2020-01-06 DIAGNOSIS — G43911 Migraine, unspecified, intractable, with status migrainosus: Secondary | ICD-10-CM | POA: Diagnosis not present

## 2020-01-06 MED ORDER — INCOBOTULINUMTOXINA 100 UNITS IM SOLR
200.0000 [IU] | INTRAMUSCULAR | Status: DC
  Administered 2020-01-06: 200 [IU] via INTRAMUSCULAR

## 2020-01-06 NOTE — Progress Notes (Signed)
**  Xeomin 200 units x 1 vial, NDC 7858-8502-77, Lot [id number], Exp 09/2022, office supply.//mck,rn**
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
## 2020-01-07 ENCOUNTER — Other Ambulatory Visit: Payer: Self-pay

## 2020-01-07 ENCOUNTER — Ambulatory Visit
Admission: RE | Admit: 2020-01-07 | Discharge: 2020-01-07 | Disposition: A | Discharge status: Home / Self Care | Payer: BC Managed Care – PPO | Source: Ambulatory Visit | Attending: Neurology | Admitting: Neurology

## 2020-01-07 DIAGNOSIS — G40209 Localization-related (focal) (partial) symptomatic epilepsy and epileptic syndromes with complex partial seizures, not intractable, without status epilepticus: Secondary | ICD-10-CM | POA: Diagnosis not present

## 2020-01-07 DIAGNOSIS — Z9889 Other specified postprocedural states: Secondary | ICD-10-CM

## 2020-01-07 DIAGNOSIS — G43911 Migraine, unspecified, intractable, with status migrainosus: Secondary | ICD-10-CM

## 2020-01-11 ENCOUNTER — Telehealth: Payer: Self-pay | Admitting: Neurology

## 2020-01-11 NOTE — Telephone Encounter (Signed)
Left message requesting a return call.

## 2020-01-11 NOTE — Telephone Encounter (Signed)
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  There are mild changes of chronic microvascular ischemia.  No acute abnormalities noted

## 2020-01-12 NOTE — Telephone Encounter (Signed)
I spoke to the patient. She verbalized understanding of her MRI results. She has her EEG scheduled on 01/19/20. She will keep the requested documentation of any seizure-like events and headaches.

## 2020-01-12 NOTE — Telephone Encounter (Signed)
Left second message requesting a return call. 

## 2020-01-19 ENCOUNTER — Ambulatory Visit: Payer: BC Managed Care – PPO | Admitting: Neurology

## 2020-01-19 DIAGNOSIS — G40209 Localization-related (focal) (partial) symptomatic epilepsy and epileptic syndromes with complex partial seizures, not intractable, without status epilepticus: Secondary | ICD-10-CM

## 2020-01-19 DIAGNOSIS — Z9889 Other specified postprocedural states: Secondary | ICD-10-CM

## 2020-01-19 DIAGNOSIS — G43911 Migraine, unspecified, intractable, with status migrainosus: Secondary | ICD-10-CM

## 2020-01-26 NOTE — Procedures (Signed)
   HISTORY: 54 year old female with history of migraine headache and seizure  TECHNIQUE:  This is a routine 16 channel EEG recording with one channel devoted to a limited EKG recording.  It was performed during wakefulness, drowsiness and asleep.  Hyperventilation and photic stimulation were performed as activating procedures.  There are minimum muscle and movement artifact noted.  Upon maximum arousal, posterior dominant waking rhythm consistent of rhythmic alpha range activity, with frequency of 12 Hz. Activities are symmetric over the bilateral posterior derivations and attenuated with eye opening.  Hyperventilation produced mild/moderate buildup with higher amplitude and the slower activities noted.  Photic stimulation did not alter the tracing.  During EEG recording, patient developed drowsiness and no deeper stage of sleep was achieved During EEG recording, there was no epileptiform discharge noted.  EKG demonstrate sinus rhythm, with heart rate of 72 bpm  CONCLUSION: This is a  normal awake EEG.  There is no electrodiagnostic evidence of epileptiform discharge.  [name], M.D. Ph.D.  [facility] [address] [place], [place] [address] Phone: [phone] Fax:      [phone]

## 2020-03-15 ENCOUNTER — Other Ambulatory Visit: Payer: Self-pay | Admitting: Neurology

## 2020-04-11 ENCOUNTER — Telehealth: Payer: Self-pay | Admitting: Neurology

## 2020-04-11 ENCOUNTER — Other Ambulatory Visit: Payer: Self-pay | Admitting: Neurology

## 2020-04-11 MED ORDER — BOTOX 100 UNITS IJ SOLR: 200.0000 [IU] | INTRAMUSCULAR | 2 refills | Status: DC

## 2020-04-11 NOTE — Telephone Encounter (Signed)
Patient has a Botox injection tomorrow. She has a PA on file with [employer]. Tomorrow will be the last time patient is B/B for Botox with this insurance. Can you send patient's Botox prescription to [facility]?  [address] [place], [place] [address]  Phone: [phone] Fax: [phone]

## 2020-04-11 NOTE — Telephone Encounter (Signed)
I have forwarded the script to [facility] for the patient.

## 2020-04-12 ENCOUNTER — Other Ambulatory Visit: Payer: Self-pay

## 2020-04-12 ENCOUNTER — Ambulatory Visit: Payer: BC Managed Care – PPO | Admitting: Neurology

## 2020-04-12 ENCOUNTER — Encounter: Payer: Self-pay | Admitting: Neurology

## 2020-04-12 VITALS — BP 122/68 | HR 92 | Ht 62.0 in | Wt 335.0 lb

## 2020-04-12 DIAGNOSIS — Z9889 Other specified postprocedural states: Secondary | ICD-10-CM

## 2020-04-12 DIAGNOSIS — G40209 Localization-related (focal) (partial) symptomatic epilepsy and epileptic syndromes with complex partial seizures, not intractable, without status epilepticus: Secondary | ICD-10-CM | POA: Diagnosis not present

## 2020-04-12 DIAGNOSIS — G43911 Migraine, unspecified, intractable, with status migrainosus: Secondary | ICD-10-CM | POA: Diagnosis not present

## 2020-04-12 MED ORDER — LAMOTRIGINE 200 MG PO TABS: 400.0000 mg | ORAL_TABLET | Freq: Two times a day (BID) | ORAL | 4 refills | Status: DC

## 2020-04-12 MED ORDER — ELETRIPTAN HYDROBROMIDE 40 MG PO TABS
40.0000 mg | ORAL_TABLET | ORAL | 6 refills | Status: DC | PRN
Start: 2020-04-12 — End: 2020-10-19

## 2020-04-12 MED ORDER — ONABOTULINUMTOXINA 100 UNITS IJ SOLR
200.0000 [IU] | Freq: Once | INTRAMUSCULAR | Status: AC
  Administered 2020-04-12: 200 [IU] via INTRAMUSCULAR

## 2020-04-12 MED ORDER — TOPIRAMATE 100 MG PO TABS: 100.0000 mg | ORAL_TABLET | Freq: Two times a day (BID) | ORAL | 4 refills | Status: DC

## 2020-04-12 NOTE — Progress Notes (Signed)
**  Botox 100 units x 2 vials, NDC 7741-4239-53, Lot [id number], Exp 06/2022, office supply.//mck,rn**

## 2020-04-12 NOTE — Patient Instructions (Signed)
Relpax as needed for headache,  May mix it with tizanidine as muscle relaxant Zofran for nausea Aleve as needed for prolonged severe headaches   Please limit the use to<= twice each week  Increase lamotrigine to 200 mg 2 tablets twice a day (you are taking 200 mg 1 tablet, 2 tablets every night now)  Keep Topamax 100mg  twice a day
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
## 2020-04-13 LAB — LAMOTRIGINE LEVEL: Lamotrigine Lvl: 3.2 ug/mL (ref 2.0–20.0)

## 2020-04-13 LAB — TOPIRAMATE LEVEL: Topiramate Lvl: 3.9 ug/mL (ref 2.0–25.0)

## 2020-04-14 ENCOUNTER — Other Ambulatory Visit: Payer: Self-pay | Admitting: Neurology

## 2020-04-19 ENCOUNTER — Other Ambulatory Visit: Payer: Self-pay

## 2020-04-19 ENCOUNTER — Emergency Department
Admission: EM | Admit: 2020-04-19 | Discharge: 2020-04-19 | Disposition: A | Discharge status: Home / Self Care | Payer: BC Managed Care – PPO | Attending: Emergency Medicine | Admitting: Emergency Medicine

## 2020-04-19 ENCOUNTER — Encounter: Payer: Self-pay | Admitting: Emergency Medicine

## 2020-04-19 ENCOUNTER — Emergency Department: Payer: BC Managed Care – PPO

## 2020-04-19 DIAGNOSIS — J40 Bronchitis, not specified as acute or chronic: Secondary | ICD-10-CM | POA: Diagnosis not present

## 2020-04-19 DIAGNOSIS — Z86718 Personal history of other venous thrombosis and embolism: Secondary | ICD-10-CM | POA: Diagnosis not present

## 2020-04-19 DIAGNOSIS — Z79899 Other long term (current) drug therapy: Secondary | ICD-10-CM | POA: Diagnosis not present

## 2020-04-19 DIAGNOSIS — R05 Cough: Secondary | ICD-10-CM | POA: Diagnosis present

## 2020-04-19 LAB — CBC WITH DIFFERENTIAL/PLATELET
Abs Immature Granulocytes: 0.07 10*3/uL (ref 0.00–0.07)
Basophils Absolute: 0 10*3/uL (ref 0.0–0.1)
Basophils Relative: 0 %
Eosinophils Absolute: 0.1 10*3/uL (ref 0.0–0.5)
Eosinophils Relative: 1 %
HCT: 33.8 % — ABNORMAL LOW (ref 36.0–46.0)
Hemoglobin: 10.6 g/dL — ABNORMAL LOW (ref 12.0–15.0)
Immature Granulocytes: 1 %
Lymphocytes Relative: 14 %
Lymphs Abs: 1.9 10*3/uL (ref 0.7–4.0)
MCH: 26 pg (ref 26.0–34.0)
MCHC: 31.4 g/dL (ref 30.0–36.0)
MCV: 82.8 fL (ref 80.0–100.0)
Monocytes Absolute: 1.8 10*3/uL — ABNORMAL HIGH (ref 0.1–1.0)
Monocytes Relative: 13 %
Neutro Abs: 9.7 10*3/uL — ABNORMAL HIGH (ref 1.7–7.7)
Neutrophils Relative %: 71 %
Platelets: 356 10*3/uL (ref 150–400)
RBC: 4.08 MIL/uL (ref 3.87–5.11)
RDW: 15.9 % — ABNORMAL HIGH (ref 11.5–15.5)
WBC: 13.6 10*3/uL — ABNORMAL HIGH (ref 4.0–10.5)
nRBC: 0 % (ref 0.0–0.2)

## 2020-04-19 LAB — BASIC METABOLIC PANEL
Anion gap: 11 (ref 5–15)
BUN: 12 mg/dL (ref 6–20)
CO2: 25 mmol/L (ref 22–32)
Calcium: 9.8 mg/dL (ref 8.9–10.3)
Chloride: 106 mmol/L (ref 98–111)
Creatinine, Ser: 0.86 mg/dL (ref 0.44–1.00)
GFR calc Af Amer: >60 mL/min (ref >60)
GFR calc non Af Amer: >60 mL/min (ref >60)
Glucose, Bld: 106 mg/dL — ABNORMAL HIGH (ref 70–99)
Potassium: 3.6 mmol/L (ref 3.5–5.1)
Sodium: 142 mmol/L (ref 135–145)

## 2020-04-19 MED ORDER — AMOXICILLIN-POT CLAVULANATE 875-125 MG PO TABS: 1.0000 | ORAL_TABLET | Freq: Two times a day (BID) | ORAL | 0 refills | Status: AC

## 2020-04-19 MED ORDER — HYDROCOD POLST-CPM POLST ER 10-8 MG/5ML PO SUER
5.0000 mL | Freq: Two times a day (BID) | ORAL | 0 refills | Status: DC | PRN
Start: 2020-04-19 — End: 2020-12-05

## 2020-04-19 MED ORDER — HYDROCOD POLST-CPM POLST ER 10-8 MG/5ML PO SUER
5.0000 mL | Freq: Once | ORAL | Status: AC
  Administered 2020-04-19: 5 mL via ORAL
  Filled 2020-04-19: qty 5

## 2020-04-19 MED ORDER — DEXAMETHASONE SODIUM PHOSPHATE 10 MG/ML IJ SOLN
10.0000 mg | Freq: Once | INTRAMUSCULAR | Status: AC
  Administered 2020-04-19: 10 mg via INTRAMUSCULAR
  Filled 2020-04-19: qty 1

## 2020-04-19 MED ORDER — IPRATROPIUM-ALBUTEROL 0.5-2.5 (3) MG/3ML IN SOLN
3.0000 mL | Freq: Once | RESPIRATORY_TRACT | Status: AC
  Administered 2020-04-19: 3 mL via RESPIRATORY_TRACT
  Filled 2020-04-19: qty 3

## 2020-04-19 MED ORDER — AMOXICILLIN-POT CLAVULANATE 875-125 MG PO TABS
1.0000 | ORAL_TABLET | Freq: Once | ORAL | Status: AC
  Administered 2020-04-19: 1 via ORAL
  Filled 2020-04-19: qty 1

## 2020-04-19 NOTE — ED Provider Notes (Signed)
[facility] Emergency Department Provider Note   ____________________________________________    I have reviewed the triage vital signs and the nursing notes.   HISTORY  Chief Complaint Shortness of Breath and Cough     HPI Lorraine Hurst is a 54 y.o. female with a history as noted below who presents with complaints of cough x4 days.  She also reports hoarse voice.  She was put on prednisone by her PCP as well as cough medication with little improvement.  Denies fevers or chills.  Has had negative Covid test.  Has discomfort in chest when coughing from coughing.  No calf pain or swelling.  No sick contacts reported.  No recent travel.  Past Medical History:  Diagnosis Date  . Acute DVT (deep venous thrombosis) (HCC)   . Anemia   . Arthritis   . Chronic back pain   . Chronic nausea   . Clotting disorder (HCC)   . Fibromyalgia   . History of DVT (deep vein thrombosis)    right leg  . Hyperlipemia   . Migraine   . Murmur   . Right shoulder pain   . Seizures [facility])     Patient Active Problem List   Diagnosis Date Noted  . Partial symptomatic epilepsy with complex partial seizures, not intractable, without status epilepticus (HCC) 12/24/2019  . Intractable migraine with status migrainosus 12/24/2019  . Status post craniotomy 12/24/2019  . Chronic migraine 08/10/2019  . Seizure disorder (HCC) 04/21/2019    Past Surgical History:  Procedure Laterality Date  . IVC FILTER INSERTION    . LAPAROSCOPIC GASTRIC BANDING    . LEG SURGERY    . SUBDURAL HEMATOMA EVACUATION VIA CRANIOTOMY     age 62    Prior to Admission medications   Medication Sig Start Date End Date Taking? Authorizing Provider  amoxicillin-clavulanate (AUGMENTIN) 875-125 MG tablet Take 1 tablet by mouth 2 (two) times daily for 7 days. 04/19/20 04/26/20  [name], MD  botulinum toxin Type A (BOTOX) 100 units SOLR injection Inject 200 Units into the muscle every 3 (three)  months. 04/11/20   [name], MD  chlorpheniramine-HYDROcodone [facility] PENNKINETIC ER) 10-8 MG/5ML SUER Take 5 mLs by mouth every 12 (twelve) hours as needed for cough. 04/19/20   [name], MD  Cholecalciferol (D2000 ULTRA STRENGTH) 50 MCG (2000 UT) CAPS Take 2,000 Units by mouth daily.     [provider]  clonazePAM (KLONOPIN) 1 MG tablet Take 1 mg by mouth 2 (two) times daily.    [provider]  diclofenac sodium (VOLTAREN) 1 % GEL Apply 2 g topically daily as needed.    [provider]  DULoxetine (CYMBALTA) 30 MG capsule Take 30 mg by mouth at bedtime.  10/14/18   [provider]  DULoxetine (CYMBALTA) 60 MG capsule Take 60 mg by mouth every morning.  08/15/18   [provider]  eletriptan (RELPAX) 40 MG tablet Take 1 tablet (40 mg total) by mouth as needed for migraine or headache. May repeat in 2 hours if headache persists or recurs. 04/12/20   [name], MD  ferrous sulfate 325 (65 FE) MG tablet Take 325 mg by mouth daily.     [provider]  HYDROcodone-acetaminophen (NORCO/VICODIN) 5-325 MG tablet Take 1 tablet by mouth 4 (four) times daily. 08/03/19   [provider]  lamoTRIgine (LAMICTAL) 200 MG tablet Take 2 tablets (400 mg total) by mouth 2 (two) times daily. 04/12/20   [name],  [name], MD  magnesium oxide (MAG-OX) 400 MG tablet Take 400 mg by mouth daily.     [provider]  Multiple Vitamin (MULTI-VITAMIN) tablet Take 1 tablet by mouth daily.     [provider]  NABUMETONE PO Take 750 mg by mouth 2 (two) times daily as needed.    [provider]  Omega-3 Fatty Acids (FISH OIL PO) Take 1 tablet by mouth daily.     [provider]  ondansetron (ZOFRAN) 4 MG tablet Take 4 mg by mouth every 8 (eight) hours as needed for nausea or vomiting.    [provider]  pantoprazole (PROTONIX) 40 MG tablet Take 40 mg by mouth daily.  07/07/18   [provider]  potassium chloride  SA (K-DUR) 20 MEQ tablet Take 20 mEq by mouth daily.  08/07/16   [provider]  rizatriptan (MAXALT-MLT) 10 MG disintegrating tablet Take 1 tablet (10 mg total) by mouth as needed. May repeat in 2 hours if needed 12/24/19   [name], MD  tiZANidine (ZANAFLEX) 4 MG tablet TAKE ONE TABLET BY MOUTH TWICE DAILY AS NEEDED 02/13/19   [provider]  topiramate (TOPAMAX) 100 MG tablet Take 1 tablet (100 mg total) by mouth 2 (two) times daily. 04/12/20   [name], MD  traMADol (ULTRAM) 50 MG tablet Take 50 mg by mouth 3 (three) times daily. 07/14/19   [provider]  vitamin B-12 (CYANOCOBALAMIN) 1000 MCG tablet Take 5,000 mcg by mouth daily.     [provider]  VITAMIN E PO Take 400 Units by mouth daily.     [provider]     Allergies Patient has no known allergies.  Family History  Problem Relation Age of Onset  . Healthy Mother   . Leukemia Father        died at age 29    Social History Social History   Tobacco Use  . Smoking status: Never Smoker  . Smokeless tobacco: Never Used  Substance Use Topics  . Alcohol use: No  . Drug use: Never    Review of Systems  Constitutional: No fever/chills Eyes: No visual changes.  ENT: Hoarse voice Cardiovascular: Denies chest pain. Respiratory: Cough Gastrointestinal: No abdominal pain.  No nausea, no vomiting.   Genitourinary: Negative for dysuria. Musculoskeletal: Negative for back pain. Skin: Negative for rash. Neurological: Negative for headaches   ____________________________________________   PHYSICAL EXAM:  VITAL SIGNS: ED Triage Vitals  Enc Vitals Group     BP 04/19/20 1148 (!) 119/91     Pulse Rate 04/19/20 1148 93     Resp 04/19/20 1148 (!) 28     Temp 04/19/20 1148 98.9 F (37.2 C)     Temp Source 04/19/20 1148 Oral     SpO2 04/19/20 1148 100 %     Weight 04/19/20 1149 (!) 152 kg (335 lb)     Height 04/19/20 1149 1.575 m (5\' 2" )     Head Circumference --       Peak Flow --      Pain Score 04/19/20 1216 10     Pain Loc --      Pain Edu? --      Excl. in GC? --     Constitutional: Alert and oriented.   Nose: No congestion/rhinnorhea. Mouth/Throat: Mucous membranes are moist.    Cardiovascular: Normal rate, regular rhythm.   Good peripheral circulation. Respiratory: Mild tachypnea from coughing, no retractions, scattered wheezes Gastrointestinal: Soft and nontender. No  distention.    Musculoskeletal: No lower extremity tenderness nor edema.  Warm and well perfused Neurologic:  Normal speech and language. No gross focal neurologic deficits are appreciated.  Skin:  Skin is warm, dry and intact. No rash noted. Psychiatric: Mood and affect are normal. Speech and behavior are normal.  ____________________________________________   LABS (all labs ordered are listed, but only abnormal results are displayed)  Labs Reviewed  CBC WITH DIFFERENTIAL/PLATELET - Abnormal; Notable for the following components:      Result Value   WBC 13.6 (*)    Hemoglobin 10.6 (*)    HCT 33.8 (*)    RDW 15.9 (*)    Neutro Abs 9.7 (*)    Monocytes Absolute 1.8 (*)    All other components within normal limits  BASIC METABOLIC PANEL - Abnormal; Notable for the following components:   Glucose, Bld 106 (*)    All other components within normal limits   ____________________________________________  EKG  ED ECG REPORT I, [name], the attending physician, personally viewed and interpreted this ECG.  Date: 04/19/2020  Rhythm: normal sinus rhythm QRS Axis: normal Intervals: normal ST/T Wave abnormalities: normal Narrative Interpretation: no evidence of acute ischemia  ____________________________________________   ____________________________________________   PROCEDURES  Procedure(s) performed: No  Procedures   Critical Care performed: No ____________________________________________   INITIAL IMPRESSION / ASSESSMENT AND PLAN / ED  COURSE  Pertinent labs & imaging results that were available during my care of the patient were reviewed by me and considered in my medical decision making (see chart for details).  Patient presents with cough as noted above.  Differential includes bronchitis, bronchospasm, non-smoker, no history of asthma, pneumonia  She is mildly tachypneic, likely related to frequent coughing.  Scattered wheezes on exam suspicious for bronchospasm with bronchitis.  Chest x-ray negative for pneumonia however mild elevation white blood cell count.  Afebrile here.  We will treat with duo nebs reportedly took a course of prednisone  ----------------------------------------- 4:20 PM on 04/19/2020 -----------------------------------------  Mild improvement after DuoNeb's.  Will add IM Decadron, discharged with Tussionex, Augmentin    ____________________________________________   FINAL CLINICAL IMPRESSION(S) / ED DIAGNOSES  Final diagnoses:  Bronchitis        Note:  This document was prepared using Dragon voice recognition software and may include unintentional dictation errors.   [name], MD 04/19/20 1620

## 2020-04-19 NOTE — ED Triage Notes (Signed)
Pt to ED via POV c/o shortness of breath and cough, pt is horse. Pt has been test for COVID twice and both have come back negative. Pt is in NAD.

## 2020-04-19 NOTE — ED Notes (Signed)
Rainbow sent to the lab.  

## 2020-04-25 ENCOUNTER — Ambulatory Visit
Admission: RE | Admit: 2020-04-25 | Discharge: 2020-04-25 | Disposition: A | Discharge status: Home / Self Care | Payer: BC Managed Care – PPO | Source: Ambulatory Visit | Attending: Family Medicine | Admitting: Family Medicine

## 2020-04-25 ENCOUNTER — Other Ambulatory Visit: Payer: Self-pay

## 2020-04-25 ENCOUNTER — Other Ambulatory Visit: Payer: Self-pay | Admitting: Family Medicine

## 2020-04-25 DIAGNOSIS — R042 Hemoptysis: Secondary | ICD-10-CM | POA: Insufficient documentation

## 2020-04-25 DIAGNOSIS — R0602 Shortness of breath: Secondary | ICD-10-CM

## 2020-04-25 DIAGNOSIS — R053 Chronic cough: Secondary | ICD-10-CM | POA: Insufficient documentation

## 2020-04-25 MED ORDER — IOHEXOL 350 MG/ML SOLN
150.0000 mL | Freq: Once | INTRAVENOUS | Status: AC | PRN
  Administered 2020-04-25: 100 mL via INTRAVENOUS

## 2020-06-02 ENCOUNTER — Telehealth: Payer: Self-pay | Admitting: Neurology

## 2020-06-02 NOTE — Telephone Encounter (Signed)
Patient has a Botox appointment on 12/22. Her current [employer] Botox PA will expire on 12/2. I filled out a new PA and MD signed. I faxed it with clinical notes to [facility]. Patient will use [facility].

## 2020-06-06 NOTE — Telephone Encounter (Signed)
Received fax back from [facility]. Only SP was added to PA. The approval dates were not extended. I will wait until 12/2 to send continuation request.

## 2020-06-28 NOTE — Telephone Encounter (Signed)
Patient has a Botox appointment on 12/22. She had a PA on file with [employer] that expired on 12/2. I filled out a new [employer] PA form for Botox and gave to MD to sign. I called the patient and let her know that we had to renew her PA, and we will order the Botox from the specialty pharmacy once we get insurance approval. Patient verbalized understanding.

## 2020-07-04 NOTE — Telephone Encounter (Signed)
Received approval from [facility] via fax. Reference #[id number] (06/28/20- 06/27/21).

## 2020-07-04 NOTE — Telephone Encounter (Signed)
I called [facility] and spoke with Jonny Ruiz to provide PA information. John added this in the system and states that they should be able to send the Botox out when the patient is ready. I called patient and advised her to call the SP when she has a chance today.

## 2020-07-06 NOTE — Telephone Encounter (Signed)
Received fax from [facility] stating that Botox will be delivered 12/21 for patient's appointment on 12/22.

## 2020-07-12 NOTE — Telephone Encounter (Signed)
Received single 200U vial of Botox today from [facility].

## 2020-07-13 ENCOUNTER — Encounter: Payer: Self-pay | Admitting: Neurology

## 2020-07-13 ENCOUNTER — Ambulatory Visit (INDEPENDENT_AMBULATORY_CARE_PROVIDER_SITE_OTHER): Payer: BC Managed Care – PPO | Admitting: Neurology

## 2020-07-13 VITALS — BP 112/73 | HR 62 | Ht 62.0 in | Wt 325.0 lb

## 2020-07-13 DIAGNOSIS — G43911 Migraine, unspecified, intractable, with status migrainosus: Secondary | ICD-10-CM | POA: Diagnosis not present

## 2020-07-13 DIAGNOSIS — G40209 Localization-related (focal) (partial) symptomatic epilepsy and epileptic syndromes with complex partial seizures, not intractable, without status epilepticus: Secondary | ICD-10-CM | POA: Diagnosis not present

## 2020-07-13 MED ORDER — TOPIRAMATE 100 MG PO TABS
200.0000 mg | ORAL_TABLET | Freq: Two times a day (BID) | ORAL | 4 refills | Status: DC
Start: 2020-07-13 — End: 2020-12-05

## 2020-07-13 MED ORDER — UBRELVY 100 MG PO TABS: 100.0000 mg | ORAL_TABLET | ORAL | 11 refills | Status: DC | PRN

## 2020-07-13 NOTE — Progress Notes (Signed)
**  Botox 100 units x 1 vial, NDC 8032-1224-82, Lot [id number], Exp 03/2023, specialty pharmacy.//mck,rn**
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
## 2020-07-14 ENCOUNTER — Telehealth: Payer: Self-pay

## 2020-07-14 NOTE — Telephone Encounter (Signed)
Approved  Effective from 07/14/2020 through 10/05/2020

## 2020-07-14 NOTE — Telephone Encounter (Signed)
I sent PA request for Ubrelvy 100mg  on C[facility], Key: [id number].   Effective from 07/14/2020 through 10/05/2020

## 2020-09-26 ENCOUNTER — Telehealth: Payer: Self-pay | Admitting: *Deleted

## 2020-09-26 NOTE — Telephone Encounter (Signed)
PA for Ubrelvy started on covermymeds (key: [id number]). Patient has coverage through [facility] [phone]). Approved through 09/23/2021.

## 2020-09-27 ENCOUNTER — Telehealth: Payer: Self-pay | Admitting: Neurology

## 2020-09-27 NOTE — Telephone Encounter (Signed)
Patient has a Botox appointment 3/30. I called [facility] and spoke with Megan to check status of order. She states order is still in the insurance department under review. Once review is complete, patient will need to give consent.

## 2020-10-03 NOTE — Telephone Encounter (Signed)
I called [facility] and spoke with Marchelle Folks to check the status of the order. Marchelle Folks states the order is still in insurance.

## 2020-10-10 NOTE — Telephone Encounter (Signed)
I called [facility] and spoke with Maralyn Sago to check Botox order status. Botox TBD 3/23.

## 2020-10-12 NOTE — Telephone Encounter (Signed)
Received (1) 200 unit vial of Botox today from [facility] pharmacy. 

## 2020-10-16 ENCOUNTER — Other Ambulatory Visit: Payer: Self-pay | Admitting: Neurology

## 2020-10-19 ENCOUNTER — Other Ambulatory Visit: Payer: Self-pay

## 2020-10-19 ENCOUNTER — Encounter: Payer: Self-pay | Admitting: Neurology

## 2020-10-19 ENCOUNTER — Ambulatory Visit: Payer: BC Managed Care – PPO | Admitting: Neurology

## 2020-10-19 ENCOUNTER — Emergency Department (HOSPITAL_COMMUNITY)
Admission: EM | Admit: 2020-10-19 | Discharge: 2020-10-19 | Disposition: A | Discharge status: Home / Self Care | Payer: BC Managed Care – PPO | Attending: Emergency Medicine | Admitting: Emergency Medicine

## 2020-10-19 ENCOUNTER — Telehealth: Payer: Self-pay | Admitting: Neurology

## 2020-10-19 VITALS — BP 129/74 | HR 88 | Ht 62.0 in | Wt 313.0 lb

## 2020-10-19 DIAGNOSIS — R569 Unspecified convulsions: Secondary | ICD-10-CM | POA: Diagnosis present

## 2020-10-19 DIAGNOSIS — G40211 Localization-related (focal) (partial) symptomatic epilepsy and epileptic syndromes with complex partial seizures, intractable, with status epilepticus: Secondary | ICD-10-CM | POA: Insufficient documentation

## 2020-10-19 DIAGNOSIS — Z9889 Other specified postprocedural states: Secondary | ICD-10-CM

## 2020-10-19 DIAGNOSIS — G40901 Epilepsy, unspecified, not intractable, with status epilepticus: Secondary | ICD-10-CM

## 2020-10-19 DIAGNOSIS — G43911 Migraine, unspecified, intractable, with status migrainosus: Secondary | ICD-10-CM | POA: Diagnosis not present

## 2020-10-19 LAB — CBC WITH DIFFERENTIAL/PLATELET
Abs Immature Granulocytes: 0.02 10*3/uL (ref 0.00–0.07)
Basophils Absolute: 0 10*3/uL (ref 0.0–0.1)
Basophils Relative: 0 %
Eosinophils Absolute: 0.1 10*3/uL (ref 0.0–0.5)
Eosinophils Relative: 1 %
HCT: 39.1 % (ref 36.0–46.0)
Hemoglobin: 11.9 g/dL — ABNORMAL LOW (ref 12.0–15.0)
Immature Granulocytes: 0 %
Lymphocytes Relative: 22 %
Lymphs Abs: 1.6 10*3/uL (ref 0.7–4.0)
MCH: 25.5 pg — ABNORMAL LOW (ref 26.0–34.0)
MCHC: 30.4 g/dL (ref 30.0–36.0)
MCV: 83.7 fL (ref 80.0–100.0)
Monocytes Absolute: 0.6 10*3/uL (ref 0.1–1.0)
Monocytes Relative: 7 %
Neutro Abs: 5.2 10*3/uL (ref 1.7–7.7)
Neutrophils Relative %: 70 %
Platelets: 277 10*3/uL (ref 150–400)
RBC: 4.67 MIL/uL (ref 3.87–5.11)
RDW: 16.6 % — ABNORMAL HIGH (ref 11.5–15.5)
WBC: 7.5 10*3/uL (ref 4.0–10.5)
nRBC: 0 % (ref 0.0–0.2)

## 2020-10-19 LAB — COMPREHENSIVE METABOLIC PANEL
ALT: 11 U/L (ref 0–44)
AST: 13 U/L — ABNORMAL LOW (ref 15–41)
Albumin: 3.8 g/dL (ref 3.5–5.0)
Alkaline Phosphatase: 79 U/L (ref 38–126)
Anion gap: 7 (ref 5–15)
BUN: 12 mg/dL (ref 6–20)
CO2: 24 mmol/L (ref 22–32)
Calcium: 9.6 mg/dL (ref 8.9–10.3)
Chloride: 106 mmol/L (ref 98–111)
Creatinine, Ser: 0.94 mg/dL (ref 0.44–1.00)
GFR, Estimated: >60 mL/min (ref >60)
Glucose, Bld: 94 mg/dL (ref 70–99)
Potassium: 3.9 mmol/L (ref 3.5–5.1)
Sodium: 137 mmol/L (ref 135–145)
Total Bilirubin: 0.4 mg/dL (ref 0.3–1.2)
Total Protein: 6.7 g/dL (ref 6.5–8.1)

## 2020-10-19 LAB — RAPID URINE DRUG SCREEN, HOSP PERFORMED
Amphetamines: NOT DETECTED
Barbiturates: NOT DETECTED
Benzodiazepines: POSITIVE — AB
Cocaine: NOT DETECTED
Opiates: POSITIVE — AB
Tetrahydrocannabinol: NOT DETECTED

## 2020-10-19 LAB — ETHANOL: Alcohol, Ethyl (B): <10 mg/dL (ref <10)

## 2020-10-19 LAB — CBG MONITORING, ED: Glucose-Capillary: 76 mg/dL (ref 70–99)

## 2020-10-19 MED ORDER — SODIUM CHLORIDE 0.9 % IV SOLN
200.0000 mg | INTRAVENOUS | Status: AC
  Administered 2020-10-19: 200 mg via INTRAVENOUS
  Filled 2020-10-19: qty 20

## 2020-10-19 MED ORDER — LACOSAMIDE 200 MG PO TABS: 200.0000 mg | ORAL_TABLET | Freq: Two times a day (BID) | ORAL | 5 refills | Status: DC

## 2020-10-19 NOTE — Telephone Encounter (Signed)
Please call her husband to check on her March 31

## 2020-10-19 NOTE — Progress Notes (Signed)
**  Botox 200 units x 1 vial, NDC 7106-2694-85, Lot [id number], Exp 05/2023, specialty pharmacy.//mck,rn**

## 2020-10-19 NOTE — ED Triage Notes (Signed)
Pt arrived via [facility]. EMS reports pt's neurologist requested transport from office because pt began having seizure activity while getting tx which lasted longer than her normal seizure hx which is typically less than 1 hr. Pt presents to ED caox4, in no obvious distress, with no active seizure activity. No obvious neurological deficit. Hx seizures and TBI.   5 mg versed IV admin by EMS BP- 107/51 HR- 99 RR- 16  SpO2- 99% RA
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
## 2020-10-19 NOTE — Consult Note (Signed)
Neurology Consultation Reason for Consult: Status epilepticus Referring Physician: Dalene Seltzer, E  CC: Status epilepticus  History is obtained from:patient, husband.   HPI: Lorraine Hurst is a 55 y.o. female who was in her normal state of health when her husband picked her up to take her for a neurologist appointment.  While she was at the neurologist, she began having confusion as well as then whole body jerking.  Dr. [name] was concerned for partial status epilepticus and gave her Depacon 1 g and she was brought into the emergency department.  She has since markedly improved.  Her husband states that the episode was similar to her typical seizure which she has one or two times a week, but markedly longer and therefore concerning.   She states that she does not remember coming to the hospital, but feels significantly improved at this time, essentially back to her normal self.    ROS: A 14 point ROS was performed and is negative except as noted in the HPI.  Past Medical History:  Diagnosis Date  . Acute DVT (deep venous thrombosis) (HCC)   . Anemia   . Arthritis   . Chronic back pain   . Chronic nausea   . Clotting disorder (HCC)   . Fibromyalgia   . History of DVT (deep vein thrombosis)    right leg  . Hyperlipemia   . Migraine   . Murmur   . Right shoulder pain   . Seizures (HCC)      Family History  Problem Relation Age of Onset  . Healthy Mother   . Leukemia Father        died at age 15     Social History:  reports that she has never smoked. She has never used smokeless tobacco. She reports that she does not drink alcohol and does not use drugs.   Exam: Current vital signs: BP (!) 108/48   Pulse 63   Temp 98.7 F (37.1 C) (Oral)   Resp 13   Ht 5\' 2"  (1.575 m)   Wt (!) 142 kg   LMP 04/22/2015 (Approximate)   SpO2 95%   BMI 57.26 kg/m  Vital signs in last 24 hours: Temp:  [98.7 F (37.1 C)] 98.7 F (37.1 C) (03/30 1750) Pulse Rate:  [63-88] 63 (03/30  1930) Resp:  [13-17] 13 (03/30 1930) BP: (105-129)/(48-82) 108/48 (03/30 1930) SpO2:  [95 %-100 %] 95 % (03/30 1930) Weight:  [142 kg] 142 kg (03/30 1826)   Physical Exam  Constitutional: Appears well-developed and well-nourished.  Psych: Affect appropriate to situation Eyes: No scleral injection HENT: No OP obstruction MSK: no joint deformities.  Cardiovascular: Normal rate and regular rhythm.  Respiratory: Effort normal, non-labored breathing GI: Soft.  No distension. There is no tenderness.  Skin: WDI  Neuro: Mental Status: Patient is awake, alert, oriented to person, place, month, year, and situation. Patient is able to give a clear and coherent history. No signs of aphasia or neglect Cranial Nerves: II: Visual Fields are full. Pupils are equal, round, and reactive to light.   III,IV, VI: EOMI without ptosis or diploplia.  V: Facial sensation is symmetric to temperature VII: Facial movement is symmetric.  VIII: hearing is intact to voice X: Uvula elevates symmetrically XI: Shoulder shrug is symmetric. XII: tongue is midline without atrophy or fasciculations.  Motor: Tone is normal. Bulk is normal. 5/5 strength was present in all four extremities.  Sensory: Sensation is symmetric to light touch and temperature in the arms  and legs. Cerebellar: FNF and HKS are intact bilaterally   I have reviewed labs in epic and the results pertinent to this consultation are: CMP-unremarkable CBC-unremarkable other than very mild anemia Topiramate and lamotrigine level-pending  I have reviewed the images obtained: Previous MRI-left frontal and to a lesser degree temporal encephalomalacia  Impression: 55 year old female with episode concerning for partial status epilepticus which resolved with Depacon.  With her return to her normal self, I do not have any concerns that she is in ongoing status epilepticus at this time.  She has a prescription for Vimpat, but will not build to get it  filled until tomorrow.  Given the duration of her seizure, I offered admission to both her and her husband, but they favored discharge at this time.  Given that this seizure was typical other than duration, and the fact that her husband will be with her and can activate emergency services if need be, I do think that discharge is reasonable at this time.  I would favor starting her Vimpat prior to discharge.  Given that she was no longer in status epilepticus, stat connection to continuous EEG was not indicated.  If she continues to have spells and spell characterization is desired, an EMU stay or ambulatory EEG monitoring could be pursued.  Recommendations: 1) Vimpat 200 mg IV x1 2) start Vimpat as recommended by Dr. [name]  3) Dr. [name] to follow-up drug levels, make any changes to lamotrigine or topiramate as desired.   Ritta Slot, MD [facility] [phone]  If 7pm- 7am, please page neurology on call as listed in [facility].

## 2020-10-19 NOTE — ED Provider Notes (Signed)
[facility] EMERGENCY DEPARTMENT Provider Note   CSN: [id number] Arrival date & time: 10/19/20  1739     History Chief Complaint  Patient presents with  . Seizures    Lorraine Hurst is a 55 y.o. female.  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  History is limited by patient's altered mental status on arrival to the emergency department.  Spoke with Dr. [name], Neurology History of TBI at age 12  Recent MRI last year encephalomalacia left frontal and temporal lobe Complex partial seizure Scheduled botox injection for migraine prevention but noticed she was slower than usual and then noted to be more confused, word finding difficulties, posturing, thinks partial seizure.  Her husband had dropped her off and was initially waiting in the car.  She had no other symptoms this morning, was otherwise in a normal state of health prior to this event.  Depacon, more confused, right sided, right hand posturing  Supposed to take lamotrigine and topamax, today started on vimpat which is new rx that she has not filled Klonopin 1mg  BID, is home alone managing her own medications  Had lab draw for topamax and lomtrigine  Patient able to state her name but not able to state why she is in the emergency department.  She is speaking slowly  On reevaluation and talking with husband she states she feels better but has continued headache.  Has hx of headaches and seizures since she was young but symptoms have been getting worse, more frequent, had outpt MRI with Dr. regarding this.  No trauma, no fever, no other acute concerns.  Notes having similar episodes to today but usually they do not last this  long, they have been happening frequently but has never had episode of this duration. Frustrated as she did not expect her prior TBI to result in progressive symptoms.  Reports kaleidoscope type visual changes, then reports she does not remember event.  Past Medical History:  Diagnosis Date  . Acute DVT (deep venous thrombosis) (HCC)   . Anemia   . Arthritis   . Chronic back pain   . Chronic nausea   . Clotting disorder (HCC)   . Fibromyalgia   . History of DVT (deep vein thrombosis)    right leg  . Hyperlipemia   . Migraine   . Murmur   . Right shoulder pain   . Seizures [facility])     Patient Active Problem List   Diagnosis Date Noted  . Partial symptomatic epilepsy with complex partial seizures, intractable, with status epilepticus (HCC) 10/19/2020  . Partial symptomatic epilepsy with complex partial seizures, not intractable, without status epilepticus (HCC) 12/24/2019  . Intractable migraine with status migrainosus 12/24/2019  . Status post craniotomy 12/24/2019  . Chronic migraine 08/10/2019  . Seizure disorder (HCC) 04/21/2019    Past Surgical History:  Procedure Laterality Date  . IVC FILTER INSERTION    . LAPAROSCOPIC GASTRIC BANDING    . LEG SURGERY    . SUBDURAL HEMATOMA EVACUATION VIA CRANIOTOMY     age 55     OB History   No obstetric history on file.     Family History  Problem Relation Age of Onset  . Healthy Mother   .  Leukemia Father        died at age 55    Social History   Tobacco Use  . Smoking status: Never Smoker  . Smokeless tobacco: Never Used  Substance Use Topics  . Alcohol use: No  . Drug use: Never    Home Medications Prior to Admission medications   Medication Sig Start Date End Date Taking? Authorizing Provider  BIOTIN PO Take 5,000 mcg by mouth daily.   Yes [provider]  botulinum toxin Type A (BOTOX) 100 units SOLR injection Inject 200 Units into the muscle every 3 (three) months. 04/11/20  Yes Levert FeinsteinYan, [name], MD   Cholecalciferol (D2000 ULTRA STRENGTH) 50 MCG (2000 UT) CAPS Take 2,000 Units by mouth daily.    Yes [provider]  clonazePAM (KLONOPIN) 1 MG tablet Take 1 mg by mouth 2 (two) times daily.   Yes [provider]  diclofenac sodium (VOLTAREN) 1 % GEL Apply 2 g topically daily as needed (pain).   Yes [provider]  DULoxetine (CYMBALTA) 30 MG capsule Take 30 mg by mouth at bedtime.  10/14/18  Yes [provider]  DULoxetine (CYMBALTA) 60 MG capsule Take 60 mg by mouth every morning.  08/15/18  Yes [provider]  ferrous sulfate 325 (65 FE) MG tablet Take 325 mg by mouth daily.    Yes [provider]  HYDROcodone-acetaminophen (NORCO/VICODIN) 5-325 MG tablet Take 1 tablet by mouth every 6 (six) hours as needed for moderate pain. 08/03/19  Yes [provider]  lacosamide (VIMPAT) 200 MG TABS tablet Take 1 tablet (200 mg total) by mouth 2 (two) times daily. 10/19/20  Yes Levert FeinsteinYan, [name], MD  lamoTRIgine (LAMICTAL) 200 MG tablet Take 2 tablets (400 mg total) by mouth 2 (two) times daily. 04/12/20  Yes Levert FeinsteinYan, [name], MD  magnesium oxide (MAG-OX) 400 MG tablet Take 400 mg by mouth daily.    Yes [provider]  Multiple Vitamin (MULTI-VITAMIN) tablet Take 1 tablet by mouth daily.    Yes [provider]  NABUMETONE PO Take 750 mg by mouth 2 (two) times daily as needed (pain).   Yes [provider]  Omega-3 Fatty Acids (FISH OIL PO) Take 1 tablet by mouth daily.    Yes [provider]  ondansetron (ZOFRAN) 8 MG tablet Take 8 mg by mouth every 8 (eight) hours as needed for vomiting or nausea. 05/02/20  Yes [provider]  pantoprazole (PROTONIX) 40 MG tablet Take 40 mg by mouth daily as needed (acid reflux). 07/07/18  Yes [provider]  potassium chloride SA (K-DUR) 20 MEQ tablet Take 20 mEq by mouth daily.  08/07/16  Yes [provider]  SUMAtriptan (IMITREX) 100 MG tablet Take 100 mg by  mouth every 2 (two) hours as needed for migraine. 10/17/20  Yes [provider]  tiZANidine (ZANAFLEX) 4 MG tablet 4 mg every 6 (six) hours as needed for muscle spasms. 02/13/19  Yes [provider]  topiramate (TOPAMAX) 100 MG tablet Take 2 tablets (200 mg total) by mouth 2 (two) times daily. 07/13/20  Yes Levert FeinsteinYan, [name], MD  traMADol (ULTRAM) 50 MG tablet Take 50 mg by mouth every 6 (six) hours as needed for moderate pain. 07/14/19  Yes [provider]  Ubrogepant (UBRELVY) 100 MG TABS Take 100 mg by mouth as needed. Patient taking differently: Take 100 mg by mouth as needed (migraine). 07/13/20  Yes Levert FeinsteinYan, [name], MD  vitamin B-12 (CYANOCOBALAMIN) 1000 MCG tablet Take 5,000 mcg by  mouth daily.    Yes [provider]  VITAMIN E PO Take 400 Units by mouth daily.    Yes [provider]  chlorpheniramine-HYDROcodone (TUSSIONEX PENNKINETIC ER) 10-8 MG/5ML SUER Take 5 mLs by mouth every 12 (twelve) hours as needed for cough. Patient not taking: No sig reported 04/19/20   [name], MD  rizatriptan (MAXALT-MLT) 10 MG disintegrating tablet Take 1 tablet (10 mg total) by mouth as needed. May repeat in 2 hours if needed Patient not taking: Reported on 10/19/2020 12/24/19   [name], MD    Allergies    Patient has no known allergies.  Review of Systems   Review of Systems  Constitutional: Negative for fever.  Eyes: Visual disturbance: will have kaleidoscope visual changes before or at time of seizures.  Cardiovascular: Negative for chest pain.  Gastrointestinal: Negative for abdominal pain.  Genitourinary: Negative for difficulty urinating.  Musculoskeletal: Negative for neck pain.  Skin: Negative for rash.  Neurological: Positive for seizures and headaches.    Physical Exam Updated Vital Signs BP 113/63   Pulse 80   Temp 97.9 F (36.6 C) (Oral)   Resp 13   Ht 5\' 2"  (1.575 m)   Wt (!) 142 kg   LMP 04/22/2015 (Approximate)   SpO2 99%   BMI 57.26  kg/m   Physical Exam Vitals and nursing note reviewed.  Constitutional:      General: She is not in acute distress.    Appearance: She is well-developed. She is not diaphoretic.  HENT:     Head: Normocephalic and atraumatic.  Eyes:     Conjunctiva/sclera: Conjunctivae normal.  Cardiovascular:     Rate and Rhythm: Normal rate and regular rhythm.  Pulmonary:     Effort: Pulmonary effort is normal. No respiratory distress.  Musculoskeletal:        General: No tenderness.     Cervical back: Normal range of motion.  Skin:    General: Skin is warm and dry.     Findings: No erythema or rash.  Neurological:     Mental Status: She is alert.     Comments: On initial evaluation, staring straight ahead, looking around, will respond to questions but in slowed nature, arms held up in flexion but when asked to release or follow commands will without signs of focal abnormalities. Had brief full body myoclonic jerk at time of my evaluation     ED Results / Procedures / Treatments   Labs (all labs ordered are listed, but only abnormal results are displayed) Labs Reviewed  CBC WITH DIFFERENTIAL/PLATELET - Abnormal; Notable for the following components:      Result Value   Hemoglobin 11.9 (*)    MCH 25.5 (*)    RDW 16.6 (*)    All other components within normal limits  COMPREHENSIVE METABOLIC PANEL - Abnormal; Notable for the following components:   AST 13 (*)    All other components within normal limits  RAPID URINE DRUG SCREEN, HOSP PERFORMED - Abnormal; Notable for the following components:   Opiates POSITIVE (*)    Benzodiazepines POSITIVE (*)    All other components within normal limits  ETHANOL  CBG MONITORING, ED    EKG EKG Interpretation  Date/Time:  Wednesday October 19 2020 20:58:49 EDT Ventricular Rate:  71 PR Interval:  62 QRS Duration: 104 QT Interval:  410 QTC Calculation: 446 R Axis:   116 Text Interpretation: Sinus or ectopic atrial rhythm Short PR interval  Probable lateral infarct, age indeterminate  No significant change since last tracing Confirmed by Alvira Monday ([id number]) on 10/19/2020 9:08:39 PM Also confirmed by Alvira Monday ([id number]), editor [place], [place] [phone])  on 10/20/2020 10:32:15 AM   Radiology No results found.  Procedures Procedures   Medications Ordered in ED Medications  lacosamide (VIMPAT) 200 mg in sodium chloride 0.9 % 25 mL IVPB (0 mg Intravenous Stopped 10/19/20 2140)    ED Course  I have reviewed the triage vital signs and the nursing notes.  Pertinent labs & imaging results that were available during my care of the patient were reviewed by me and considered in my medical decision making (see chart for details).    MDM Rules/Calculators/A&P                          55 year old female with a history of DVT, hyperlipidemia, traumatic brain injury at the age of 64 with subdural hematoma, partial symptomatic epilepsy, migraines, who presents from the neurology office for concern for partial status epilepticus that began while she was in the office after her Botox injection.  She was in her normal state of health prior to this event, and had no history of head trauma.  She received Depakote with Neurology and Versed in route with EMS.  On arrival to the emergency department, she is behaving abnormally with symptoms that may represent partial seizure or other.  She is staring, speaking slowly, responding, and has both arms bent up in flexion, however will follow commands to stop this posturing and was noted to have a myoclonic jerk.    Consulted Neurology and ordered labs to evaluate for other electrolyte abnormalities, seizure levels were drawn by Dr. Zannie Cove office.  While in the ED after receiving the previous medications she returned to baseline. Dr. Amada Jupiter of neurology evaluated her. No concerns for continuing status epilepticus.  She and her husband report that this type of seizure is typical for her but  typically does not last this long.  He ordered 1 dose of Vimpat to be given in the emergency department, with plan for patient to continue the Vimpat as prescribed by Dr. [name].  Given this plan without continuing status epilepticus feel that discharge is reasonable.  She also reports that the headache that she has is similar to what she has had, and I have low suspicion for acute bleed.  Discussed this with patient and husband, with plan for discharge, initiating Vimpat, following up closely with Dr. Debarah Crape, and return to the emergency department if any other concerns.  Final Clinical Impression(s) / ED Diagnoses Final diagnoses:  Seizure [facility])    Rx / DC Orders ED Discharge Orders    None       Alvira Monday, MD 10/20/20 1146

## 2020-10-20 ENCOUNTER — Telehealth: Payer: Self-pay | Admitting: Neurology

## 2020-10-20 LAB — TOPIRAMATE LEVEL: Topiramate Lvl: 8.1 ug/mL (ref 2.0–25.0)

## 2020-10-20 LAB — LAMOTRIGINE LEVEL: Lamotrigine Lvl: 6.4 ug/mL (ref 2.0–20.0)

## 2020-10-20 NOTE — Telephone Encounter (Signed)
Dr. Yan has reviewed patient's ED records.   I called her husband today. The patient is feeling better with the exception of fatigue and muscle soreness. Mentally back to baseline.  She has been worked into the schedule for EEG on 10/25/20. Her husband will have her at our office at 2:45pm for a 3:15 test.  He verbalized understanding to return to ED for any prolonged seizing. He is also starting her on the newly prescribed Vimpat today, in addition to continuing her other medications.   

## 2020-10-20 NOTE — Telephone Encounter (Signed)
Please call patient to check on her

## 2020-10-20 NOTE — Telephone Encounter (Signed)
Dr. [name] has reviewed patient's ED records.   I called her husband today. The patient is feeling better with the exception of fatigue and muscle soreness. Mentally back to baseline.  She has been worked into the schedule for EEG on 10/25/20. Her husband will have her at our office at 2:45pm for a 3:15 test.  He verbalized understanding to return to ED for any prolonged seizing. He is also starting her on the newly prescribed Vimpat today, in addition to continuing her other medications.

## 2020-10-25 ENCOUNTER — Ambulatory Visit: Payer: BC Managed Care – PPO | Admitting: Neurology

## 2020-10-25 DIAGNOSIS — G40211 Localization-related (focal) (partial) symptomatic epilepsy and epileptic syndromes with complex partial seizures, intractable, with status epilepticus: Secondary | ICD-10-CM

## 2020-10-25 DIAGNOSIS — G43911 Migraine, unspecified, intractable, with status migrainosus: Secondary | ICD-10-CM

## 2020-11-08 NOTE — Procedures (Signed)
   HISTORY: 54 year old female, with history of traumatic brain injury, left frontal temporal lobe cystic encephalomalacia, presenting with frequent spells suggestive for partial seizure  TECHNIQUE:  This is a routine 16 channel EEG recording with one channel devoted to a limited EKG recording.  It was performed during wakefulness, drowsiness and asleep.  Hyperventilation and photic stimulation were performed as activating procedures.  There are minimum muscle and movement artifact noted.  Upon maximum arousal, posterior dominant waking rhythm consistent of dysrhythmic mixed alpha and theta range activity, reactive to eye opening and closure  Hyperventilation produced mild/moderate buildup with higher amplitude and the slower activities noted.   Photic stimulation was performed, post hyperventilation and during photic stimulation, there was frequent protrusion of short lasting semirhythmic 3 to 4 Hz high amplitude activity involving T3, 5, 01, T4, T6 O2 leads, no clinical seizure activity noted,  During EEG recording, patient developed drowsiness and but no deeper stage of sleep was achieved,  EKG demonstrate sinus rhythm, with heart rate of 72 bpm  CONCLUSION: This is an abnormal awake EEG.  There was frequent short lasting semirhythmic protrusion of 3 to 4 Hz activity involving bilateral temporoparietal region, this suggest underlying irritability, there was no clinical seizure activity  [name], M.D. Ph.D.  [facility] [address] [place], [place] [address] Phone: [phone] Fax:      [phone]

## 2020-11-14 ENCOUNTER — Other Ambulatory Visit: Payer: Self-pay | Admitting: Neurology

## 2020-12-05 ENCOUNTER — Encounter: Payer: Self-pay | Admitting: Neurology

## 2020-12-05 ENCOUNTER — Ambulatory Visit: Payer: BC Managed Care – PPO | Admitting: Neurology

## 2020-12-05 VITALS — BP 128/72 | HR 95 | Ht 62.0 in | Wt 308.0 lb

## 2020-12-05 DIAGNOSIS — G40211 Localization-related (focal) (partial) symptomatic epilepsy and epileptic syndromes with complex partial seizures, intractable, with status epilepticus: Secondary | ICD-10-CM

## 2020-12-05 DIAGNOSIS — Z9889 Other specified postprocedural states: Secondary | ICD-10-CM | POA: Diagnosis not present

## 2020-12-05 DIAGNOSIS — R413 Other amnesia: Secondary | ICD-10-CM

## 2020-12-05 DIAGNOSIS — G43911 Migraine, unspecified, intractable, with status migrainosus: Secondary | ICD-10-CM | POA: Diagnosis not present

## 2020-12-05 MED ORDER — AIMOVIG 70 MG/ML ~~LOC~~ SOAJ: 70.0000 mg | SUBCUTANEOUS | 11 refills | Status: DC

## 2020-12-05 MED ORDER — LAMOTRIGINE 200 MG PO TABS: 400.0000 mg | ORAL_TABLET | Freq: Two times a day (BID) | ORAL | 4 refills | Status: DC

## 2020-12-05 MED ORDER — TOPIRAMATE 100 MG PO TABS: 200.0000 mg | ORAL_TABLET | Freq: Two times a day (BID) | ORAL | 4 refills | Status: DC

## 2020-12-05 MED ORDER — LACOSAMIDE 200 MG PO TABS: 200.0000 mg | ORAL_TABLET | Freq: Two times a day (BID) | ORAL | 4 refills | Status: DC
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
## 2020-12-08 ENCOUNTER — Telehealth: Payer: Self-pay | Admitting: Neurology

## 2020-12-08 NOTE — Telephone Encounter (Addendum)
I spoke to Lorraine Hurst at [facility] who confirmed she has the prescription. She will get it ready for pick up.

## 2020-12-08 NOTE — Telephone Encounter (Addendum)
[employer]BS will not pay for both Aimovig and Botox. The patient would like to hold off on Botox and try Aimovig. She will do this for at least the next three months. She has scheduled a follow up to evaluate the effectiveness.   PA for Aimovig 70mg  started on covermymeds (key: [id number]). Pt has pharmacy coverage through [facility] [phone]). Approved through 03/01/21.

## 2020-12-08 NOTE — Telephone Encounter (Signed)
Pt called stating that the [facility] informed her that they have not received her Erenumab-aooe (AIMOVIG) 70 MG/ML SOAJ prescription. Pt would like to know if this can be resent for her. Please advise.

## 2020-12-13 LAB — TOPIRAMATE LEVEL: Topiramate Lvl: 8.3 ug/mL (ref 2.0–25.0)

## 2020-12-13 LAB — LAMOTRIGINE LEVEL: Lamotrigine Lvl: 5.9 ug/mL (ref 2.0–20.0)

## 2020-12-13 LAB — VITAMIN B12: Vitamin B-12: >2000 pg/mL — ABNORMAL HIGH (ref 232–1245)

## 2020-12-13 LAB — LACOSAMIDE: Lacosamide: 4.9 ug/mL — ABNORMAL LOW (ref 5.0–10.0)

## 2020-12-13 LAB — TSH: TSH: 1.75 u[IU]/mL (ref 0.450–4.500)

## 2021-01-03 ENCOUNTER — Telehealth: Payer: Self-pay | Admitting: Neurology

## 2021-01-03 NOTE — Telephone Encounter (Signed)
Pt called and LVM stating that the pharmacy informed her that her Erenumab-aooe (AIMOVIG) 70 MG/ML SOAJ and her lacosamide (VIMPAT) 200 MG TABS tablet are needing references in order for them to fill the medications for her. Please advise.

## 2021-01-03 NOTE — Telephone Encounter (Signed)
I spoke to Lorraine Hurst at [facility]. The patient just received her Vimpat on 12/15/20. She has a prescription for Aimovig 70mg  ready today.   I called the patient back. She confirmed she has her Vimpat at home. Says she only needed the Aimovig. She is aware it is ready now for pick up at the pharmacy.

## 2021-01-17 NOTE — Telephone Encounter (Signed)
Received (1) 200 unit vial of Botox today. Patient is not scheduled for a Botox appointment at this time.

## 2021-01-25 ENCOUNTER — Ambulatory Visit: Payer: BC Managed Care – PPO | Admitting: Neurology

## 2021-02-20 ENCOUNTER — Telehealth: Payer: BC Managed Care – PPO | Admitting: *Deleted

## 2021-02-20 NOTE — Telephone Encounter (Signed)
PA for Aimovig 70mg  started on covermymeds (key: [id number]). Pt has pharmacy coverage through [facility] [place] [phone]). Decision pending.

## 2021-02-22 NOTE — Telephone Encounter (Signed)
Approved through 02/19/2022.

## 2021-04-03 ENCOUNTER — Ambulatory Visit: Payer: BC Managed Care – PPO | Admitting: Neurology

## 2021-04-03 ENCOUNTER — Encounter: Payer: Self-pay | Admitting: Neurology

## 2021-04-03 VITALS — BP 134/84 | HR 84 | Ht 62.0 in

## 2021-04-03 DIAGNOSIS — R413 Other amnesia: Secondary | ICD-10-CM

## 2021-04-03 DIAGNOSIS — G40211 Localization-related (focal) (partial) symptomatic epilepsy and epileptic syndromes with complex partial seizures, intractable, with status epilepticus: Secondary | ICD-10-CM | POA: Diagnosis not present

## 2021-04-03 DIAGNOSIS — G43911 Migraine, unspecified, intractable, with status migrainosus: Secondary | ICD-10-CM

## 2021-04-03 DIAGNOSIS — Z9889 Other specified postprocedural states: Secondary | ICD-10-CM | POA: Diagnosis not present

## 2021-04-03 MED ORDER — LAMOTRIGINE 200 MG PO TABS: ORAL_TABLET | ORAL | 4 refills | Status: DC

## 2021-04-03 MED ORDER — QULIPTA 30 MG PO TABS: 30.0000 mg | ORAL_TABLET | Freq: Every day | ORAL | 11 refills | Status: DC

## 2021-04-03 NOTE — Progress Notes (Signed)
Botox injection for chronic migraine prevention, injection was performed according to [place] protocol,  5 units of Botox was injected into each side, for 31 injection sites, total of 155 units, used total of 200 units dissolved into 4 cc of normal saline  Bilateral frontalis 4 injection sites Bilateral corrugate 2 injection sites Procerus 1 injection sites. Bilateral temporalis 8 injection sites Bilateral occipitalis 6 injection sites Bilateral cervical paraspinals 4 injection sites Bilateral upper trapezius 6 injection sites  Extra 45 unites were injected into upper cervical and upper trapezial region

## 2021-04-03 NOTE — Progress Notes (Signed)
Botox- 200 units x 1 vial Lot: [id number] Expiration: 06/2023 NDC: 8315-1761-60    Specialty pharmacy
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
## 2021-04-04 ENCOUNTER — Telehealth: Payer: Self-pay | Admitting: *Deleted

## 2021-04-04 NOTE — Telephone Encounter (Signed)
PA for Quilipta started on covermymeds (key: [id number]). Pt has pharmacy coverage through [facility] [place] [phone]). Decision pending.

## 2021-04-06 MED ORDER — AJOVY 225 MG/1.5ML ~~LOC~~ SOAJ: 225.0000 mg | SUBCUTANEOUS | 11 refills | Status: DC

## 2021-04-06 NOTE — Telephone Encounter (Signed)
PA for Ajovy started on covermymeds (key: [id number]). Decision pending.

## 2021-04-06 NOTE — Telephone Encounter (Signed)
I called [employer] and spoke to rep, [place]. Patient's plan requires her to try and failed all three injectable CGRP options (Aimovig, Ajovy, Emgality). She has only tried Aimovig.  We will need to check w/ Dr. [name] to see if Ajovy or Emgality would be an appropriate choice for the patient.

## 2021-04-06 NOTE — Telephone Encounter (Signed)
Pt called, spoke with [employer] this morning. They said need more information on why need this medication. Would like a call from the nurse.

## 2021-04-06 NOTE — Telephone Encounter (Signed)
Per vo by Dr. [name], provide rx for Ajovy 225mg , one injection per month. The patient is agreeable to this plan. New prescription sent to pharmacy.

## 2021-04-06 NOTE — Telephone Encounter (Signed)
Channel from [facility] called stating the Atogepant (QULIPTA) 30 MG TABS was denied. States provider can do a per to per review or fax over any additional information. Call back number [phone] option 3 then option 3.

## 2021-04-06 NOTE — Addendum Note (Signed)
Addended by: Lindell Spar C on: 04/06/2021 10:36 AM   Modules accepted: Orders

## 2021-04-10 NOTE — Telephone Encounter (Signed)
PA for Ajovy approved through 06/28/2021.

## 2021-06-16 ENCOUNTER — Other Ambulatory Visit: Payer: Self-pay | Admitting: Neurology

## 2021-06-27 ENCOUNTER — Telehealth: Payer: Self-pay | Admitting: Neurology

## 2021-06-27 ENCOUNTER — Other Ambulatory Visit: Payer: Self-pay | Admitting: Neurology

## 2021-06-27 NOTE — Telephone Encounter (Signed)
Patient's next Botox injection is 12/14. I called [facility] and provided a new prescription (200 units x1 vial, x4 refills). They will process the prescription and check insurance benefits.   Patient's PA for Botox with [employer] expired today. I filled out new PA form and faxed to plan.

## 2021-06-28 ENCOUNTER — Telehealth: Payer: Self-pay | Admitting: *Deleted

## 2021-06-28 NOTE — Telephone Encounter (Addendum)
PA case was loaded on covermymeds.  Previous documentation:  PA for Lorraine Hurst started on covermymeds (key: [id number]). Patient has coverage through [facility] [phone]). Approved through 09/23/2021.  Pharmacy notified.

## 2021-06-28 NOTE — Telephone Encounter (Signed)
Verified Drug Registry for lacosamide 200 mg. Last fill date: 05/16/2021 Qty 60 tablets for 30 days Last office visit: 04/03/2021 Next office visit: 07/05/2021

## 2021-06-29 NOTE — Telephone Encounter (Signed)
Received approval from [facility] PA #[id number] (06/27/21- 05/28/22). I called [facility] to check status. Order is still processing.

## 2021-07-03 ENCOUNTER — Telehealth: Payer: Self-pay

## 2021-07-03 NOTE — Telephone Encounter (Signed)
Received notice from [employer] re: Ajovy:  "Authorization has been terminated effective 06/26/21. Any previous authorizations through [facility] for a CGRP used for migraine prophylaxis will be terminated upon approval of a botulinum toxin agent (dysport, xeomin, botox, or myobloc)."   Ref #: [id number]

## 2021-07-04 NOTE — Telephone Encounter (Signed)
Spoke with [facility]. Botox TBD 12/14, but they need patient's consent to finalize the order. I called patient and made her aware. She states she will call pharmacy this evening.

## 2021-07-05 ENCOUNTER — Encounter: Payer: Self-pay | Admitting: Neurology

## 2021-07-05 ENCOUNTER — Ambulatory Visit: Payer: BC Managed Care – PPO | Admitting: Neurology

## 2021-07-05 VITALS — BP 125/64 | HR 81 | Ht 62.0 in | Wt 308.0 lb

## 2021-07-05 DIAGNOSIS — G40209 Localization-related (focal) (partial) symptomatic epilepsy and epileptic syndromes with complex partial seizures, not intractable, without status epilepticus: Secondary | ICD-10-CM | POA: Diagnosis not present

## 2021-07-05 DIAGNOSIS — G43911 Migraine, unspecified, intractable, with status migrainosus: Secondary | ICD-10-CM | POA: Diagnosis not present
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
## 2021-07-05 NOTE — Progress Notes (Signed)
**  Botox 100 units x 2 vials, NDC 8588-5027-74, Lot [id number], Exp 09/2023, specialty pharmacy.//mck,rn**

## 2021-07-05 NOTE — Progress Notes (Signed)
Botox injection for chronic migraine prevention, injection was performed according to [place] protocol,  5 units of Botox was injected into each side, for 31 injection sites, total of 155 units  Bilateral frontalis 4 injection sites Bilateral temporalis 8 injection sites Bilateral occipitalis 6 injection sites Bilateral cervical paraspinals 4 injection sites Bilateral upper trapezius 6 injection sites  Extra 60 unites were injected into bilateral parietal region, levator scapula

## 2021-07-20 ENCOUNTER — Telehealth: Payer: Self-pay | Admitting: Neurology

## 2021-07-20 NOTE — Telephone Encounter (Signed)
I spoke to the patient. She is aware that cold and flu medications can cause added drowsiness and dizziness. She is on polypharmacy for multiple health conditions. Generic versions of Topamax, Lamictal and and Vimpat for seizure control. Gean Birchwood for migraines.   I double checked this information with Dr. [name]. Also, reminded the patient that a productive cough is helpful to get the phlegm cleared. She confirmed she is drinking plenty of water to stay well hydrated.   She is going to see her PCP. She should make sure he is aware of all the medications she takes. States they have her updated list.

## 2021-07-20 NOTE — Addendum Note (Signed)
Addended by: Lindell Spar C on: 07/20/2021 03:01 PM   Modules accepted: Orders

## 2021-07-20 NOTE — Telephone Encounter (Signed)
Pt would like to know if she can take cold and flu meds along with her seizure medications prescribed by Dr [name]

## 2021-08-21 ENCOUNTER — Other Ambulatory Visit: Payer: Self-pay | Admitting: Neurology

## 2021-08-21 NOTE — Telephone Encounter (Signed)
Verify Drug Registry For Lacosamide 200 mg tablet Last Filled: 07/25/2021 Quantity: 60 tablets for 30 days Last appointment: 04/03/2021 Next appointment: 10/05/2021

## 2021-09-11 ENCOUNTER — Telehealth: Payer: Self-pay | Admitting: *Deleted

## 2021-09-11 NOTE — Telephone Encounter (Signed)
PA for Ubrelvy started on covermymeds (key: [id number]). Patient has coverage through [facility] [phone]). Decision pending.

## 2021-09-12 NOTE — Telephone Encounter (Signed)
Approved. Effective from 09/11/2021 through 09/10/2022.

## 2021-09-18 ENCOUNTER — Telehealth: Payer: Self-pay | Admitting: Neurology

## 2021-09-18 NOTE — Telephone Encounter (Signed)
Please send Botox refill to [facility]. °

## 2021-09-19 MED ORDER — BOTOX 100 UNITS IJ SOLR: 200.0000 [IU] | INTRAMUSCULAR | 3 refills | Status: DC

## 2021-10-04 NOTE — Telephone Encounter (Signed)
Received (1) 200 unit vial of Botox from [facility]. ?

## 2021-10-05 ENCOUNTER — Ambulatory Visit: Payer: BC Managed Care – PPO | Admitting: Neurology

## 2021-10-05 VITALS — BP 156/77 | HR 81

## 2021-10-05 DIAGNOSIS — G43911 Migraine, unspecified, intractable, with status migrainosus: Secondary | ICD-10-CM | POA: Diagnosis not present

## 2021-10-05 NOTE — Procedures (Signed)
? ? ? ?  BOTOX PROCEDURE NOTE FOR MIGRAINE HEADACHE ? ? ?HISTORY: Lorraine Hurst is here today for Botox injection for chronic migraine headaches.  Botox continues to be helpful.  Reduces the frequency and severity of migraines.  Has tolerated well. ? ? ?Description of procedure: ? ?The patient was placed in a sitting position. The standard protocol was used for Botox as follows, with 5 units of Botox injected at each site: ? ? ?-Procerus muscle, midline injection ? ?-Corrugator muscle, bilateral injection ? ?-Frontalis muscle, bilateral injection, with 2 sites each side, medial injection was performed in the upper one third of the frontalis muscle, in the region vertical from the medial inferior edge of the superior orbital rim. The lateral injection was again in the upper one third of the forehead vertically above the lateral limbus of the cornea, 1.5 cm lateral to the medial injection site. ? ?-Temporalis muscle injection, 4 sites, bilaterally. The first injection was 3 cm above the tragus of the ear, second injection site was 1.5 cm to 3 cm up from the first injection site in line with the tragus of the ear. The third injection site was 1.5-3 cm forward between the first 2 injection sites. The fourth injection site was 1.5 cm posterior to the second injection site. ? ?-Occipitalis muscle injection, 3 sites, bilaterally. The first injection was done one half way between the occipital protuberance and the tip of the mastoid process behind the ear. The second injection site was done lateral and superior to the first, 1 fingerbreadth from the first injection. The third injection site was 1 fingerbreadth superiorly and medially from the first injection site. ? ?-Cervical paraspinal muscle injection, 2 sites, bilateral, the first injection site was 1 cm from the midline of the cervical spine, 3 cm inferior to the lower border of the occipital protuberance. The second injection site was 1.5 cm superiorly and laterally to the  first injection site. ? ?-Trapezius muscle injection was performed at 3 sites, bilaterally. The first injection site was in the upper trapezius muscle halfway between the inflection point of the neck, and the acromion. The second injection site was one half way between the acromion and the first injection site. The third injection was done between the first injection site and the inflection point of the neck. ? ? ?A 200 unit bottle of Botox was used, 155 units were injected, the rest of the Botox was wasted. The patient tolerated the procedure well, there were no complications of the above procedure. ? ?Botox NDC [phone] ?Lot number [id number] ?Expiration date 03/2024 ?SP ? ? ?

## 2021-10-05 NOTE — Progress Notes (Signed)
Botox 200units ? ?Ndc-0023-3921-02 ?[phone] ?Exp-03/2024 ? ?SP ?

## 2021-12-15 ENCOUNTER — Other Ambulatory Visit: Payer: Self-pay | Admitting: Neurology

## 2021-12-19 NOTE — Telephone Encounter (Signed)
Rx refilled.

## 2021-12-27 NOTE — Telephone Encounter (Signed)
[place] spoke with Arbie Cookey she states pt's major medical needs to be ran and also they are needing consent from the patient. Once all of that has been done they will contact us to scheduled delivery.

## 2022-01-02 ENCOUNTER — Encounter: Payer: Self-pay | Admitting: Neurology

## 2022-01-02 ENCOUNTER — Ambulatory Visit: Payer: BC Managed Care – PPO | Admitting: Neurology

## 2022-01-02 VITALS — BP 126/74 | HR 74 | Ht 62.0 in | Wt 308.0 lb

## 2022-01-02 DIAGNOSIS — G43911 Migraine, unspecified, intractable, with status migrainosus: Secondary | ICD-10-CM | POA: Diagnosis not present

## 2022-01-02 MED ORDER — ELETRIPTAN HYDROBROMIDE 40 MG PO TABS: 40.0000 mg | ORAL_TABLET | ORAL | 3 refills | Status: DC | PRN

## 2022-01-02 NOTE — Progress Notes (Signed)
Botox- 200 units x 1vial Lot: [id number] Expiration: 2026/02 NDC: 1443-1540-08    S/P

## 2022-01-02 NOTE — Procedures (Signed)
     BOTOX PROCEDURE NOTE FOR MIGRAINE HEADACHE    HISTORY: Lorraine Hurst is here today for Botox injection for chronic migraine headache. Last injection was 10/05/21 by me.  Not on Ajovy, hasn't been on for awhile.  On Vimpat, Lamictal, Topamax for seizure prevention. [name] for acute headache treatment. She did really well with her last Botox injection, still has headaches few times a week, hard to quantify. Doesn't take her whole supply of Ubrelvy monthly. Would like prescription for Relpax to take if needed, has worked well in the past. Will have her schedule for office visit in Dec 2023.  Description of procedure:  The patient was placed in a sitting position. The standard protocol was used for Botox as follows, with 5 units of Botox injected at each site:   -Procerus muscle, midline injection  -Corrugator muscle, bilateral injection  -Frontalis muscle, bilateral injection, with 2 sites each side, medial injection was performed in the upper one third of the frontalis muscle, in the region vertical from the medial inferior edge of the superior orbital rim. The lateral injection was again in the upper one third of the forehead vertically above the lateral limbus of the cornea, 1.5 cm lateral to the medial injection site.  -Temporalis muscle injection, 4 sites, bilaterally. The first injection was 3 cm above the tragus of the ear, second injection site was 1.5 cm to 3 cm up from the first injection site in line with the tragus of the ear. The third injection site was 1.5-3 cm forward between the first 2 injection sites. The fourth injection site was 1.5 cm posterior to the second injection site.  -Occipitalis muscle injection, 3 sites, bilaterally. The first injection was done one half way between the occipital protuberance and the tip of the mastoid process behind the ear. The second injection site was done lateral and superior to the first, 1 fingerbreadth from the first  injection. The third injection site was 1 fingerbreadth superiorly and medially from the first injection site.  -Cervical paraspinal muscle injection, 2 sites, bilateral, the first injection site was 1 cm from the midline of the cervical spine, 3 cm inferior to the lower border of the occipital protuberance. The second injection site was 1.5 cm superiorly and laterally to the first injection site.  -Trapezius muscle injection was performed at 3 sites, bilaterally. The first injection site was in the upper trapezius muscle halfway between the inflection point of the neck, and the acromion. The second injection site was one half way between the acromion and the first injection site. The third injection was done between the first injection site and the inflection point of the neck.   A 200 unit bottle of Botox was used, 155 units were injected, the rest of the Botox was wasted. The patient tolerated the procedure well, there were no complications of the above procedure.  Botox NDC [id number] Lot number [id number] Expiration date 08/2024 SP

## 2022-01-11 ENCOUNTER — Telehealth: Payer: Self-pay | Admitting: Neurology

## 2022-01-11 NOTE — Telephone Encounter (Signed)
Quana@ [facility] has called UJ:[id number] of Botox Delivery call back # is [phone]

## 2022-01-16 ENCOUNTER — Telehealth: Payer: Self-pay | Admitting: Neurology

## 2022-01-17 ENCOUNTER — Other Ambulatory Visit: Payer: Self-pay | Admitting: Neurology

## 2022-01-18 ENCOUNTER — Telehealth: Payer: Self-pay | Admitting: *Deleted

## 2022-01-18 NOTE — Telephone Encounter (Signed)
PA for lacosamide 200mg  started on covermymeds (key: [id number]). Pharmacy coverage through [facility] [phone]). Decision pending.

## 2022-01-18 NOTE — Telephone Encounter (Signed)
PA approved through 01/17/2023.

## 2022-02-12 ENCOUNTER — Telehealth: Payer: Self-pay | Admitting: Neurology

## 2022-02-12 NOTE — Telephone Encounter (Signed)
[facility] ([facility]) calling to schedule delivery of Botox. Communicated with Botox Coordinator through phone staff. Scheduled delivery of Botox on 02/14/22.

## 2022-02-13 ENCOUNTER — Other Ambulatory Visit: Payer: Self-pay | Admitting: Neurology

## 2022-02-14 NOTE — Telephone Encounter (Signed)
Received (1) 200 unit vial of Botox from [facility]. ?

## 2022-02-15 NOTE — Telephone Encounter (Signed)
Rx refilled.

## 2022-03-15 ENCOUNTER — Other Ambulatory Visit: Payer: Self-pay | Admitting: Neurology

## 2022-03-15 NOTE — Telephone Encounter (Signed)
Rx filled to appointment date. 

## 2022-04-03 ENCOUNTER — Ambulatory Visit: Payer: BC Managed Care – PPO | Admitting: Neurology

## 2022-04-03 VITALS — BP 111/74 | HR 70 | Ht 62.0 in | Wt 308.0 lb

## 2022-04-03 DIAGNOSIS — G43911 Migraine, unspecified, intractable, with status migrainosus: Secondary | ICD-10-CM

## 2022-04-03 MED ORDER — ONABOTULINUMTOXINA 200 UNITS IJ SOLR
200.0000 [IU] | Freq: Once | INTRAMUSCULAR | Status: AC
  Administered 2022-04-03: 155 [IU] via INTRAMUSCULAR

## 2022-04-03 MED ORDER — ELETRIPTAN HYDROBROMIDE 40 MG PO TABS: 40.0000 mg | ORAL_TABLET | ORAL | 3 refills | Status: DC | PRN

## 2022-04-03 NOTE — Progress Notes (Signed)
    BOTOX PROCEDURE NOTE FOR MIGRAINE HEADACHE  HISTORY: Jourdin is here today for Botox injection for chronic migraine headaches.  Last injection was 01/02/22 by me.  Uses Relpax and Ubrelvy as needed.  Sometimes Bernita Raisin is not helpful.Has 1-2 migraines.  On a bad week she will have 3.  Experiences wearing off effect of Botox.  Botox has been at least 75% helpful. Also on Lamictal, Vimpat, Topamax for seizures. 1 possible seizure spell about 1 month ago in the setting of stress/travel to meet with family, otherwise under good control.   Description of procedure:  The patient was placed in a sitting position. The standard protocol was used for Botox as follows, with 5 units of Botox injected at each site:   -Procerus muscle, midline injection  -Corrugator muscle, bilateral injection  -Frontalis muscle, bilateral injection, with 2 sites each side, medial injection was performed in the upper one third of the frontalis muscle, in the region vertical from the medial inferior edge of the superior orbital rim. The lateral injection was again in the upper one third of the forehead vertically above the lateral limbus of the cornea, 1.5 cm lateral to the medial injection site.  -Temporalis muscle injection, 4 sites, bilaterally. The first injection was 3 cm above the tragus of the ear, second injection site was 1.5 cm to 3 cm up from the first injection site in line with the tragus of the ear. The third injection site was 1.5-3 cm forward between the first 2 injection sites. The fourth injection site was 1.5 cm posterior to the second injection site.  -Occipitalis muscle injection, 3 sites, bilaterally. The first injection was done one half way between the occipital protuberance and the tip of the mastoid process behind the ear. The second injection site was done lateral and superior to the first, 1 fingerbreadth from the first injection. The third injection site was 1 fingerbreadth superiorly and medially  from the first injection site.  -Cervical paraspinal muscle injection, 2 sites, bilateral, the first injection site was 1 cm from the midline of the cervical spine, 3 cm inferior to the lower border of the occipital protuberance. The second injection site was 1.5 cm superiorly and laterally to the first injection site.  -Trapezius muscle injection was performed at 3 sites, bilaterally. The first injection site was in the upper trapezius muscle halfway between the inflection point of the neck, and the acromion. The second injection site was one half way between the acromion and the first injection site. The third injection was done between the first injection site and the inflection point of the neck.   A 200 unit bottle of Botox was used, 155 units were injected, the rest of the Botox was wasted. The patient tolerated the procedure well, there were no complications of the above procedure.  Botox NDC 6734-1937-90 Lot number [id number] Expiration date 08/2024 SP  She should let me know of any further seizure events.

## 2022-04-03 NOTE — Progress Notes (Signed)
Botox 200 units x 1 vial [id number] [id number] Exp-08/2024 SP

## 2022-04-13 ENCOUNTER — Other Ambulatory Visit: Payer: Self-pay | Admitting: Neurology

## 2022-04-17 ENCOUNTER — Telehealth: Payer: Self-pay | Admitting: Neurology

## 2022-04-17 MED ORDER — LAMOTRIGINE 200 MG PO TABS: ORAL_TABLET | ORAL | 4 refills | Status: DC

## 2022-04-17 NOTE — Telephone Encounter (Signed)
Pt is calling. Stated she wants to talk to a nurse about some of her medication. Stated she might need to stay on.

## 2022-04-17 NOTE — Telephone Encounter (Signed)
I called the pt and she sts she is in need of a refill for Lamotringine. [facility] does not have updated rx for this med.  I have sent electronically for the med. Pt verbalized appreciation.

## 2022-05-14 ENCOUNTER — Other Ambulatory Visit: Payer: Self-pay | Admitting: Neurology

## 2022-05-29 ENCOUNTER — Telehealth: Payer: Self-pay | Admitting: Neurology

## 2022-05-29 NOTE — Telephone Encounter (Signed)
Renewal PA for botox needed.  Dosage 200 units, d/x code G43.911.

## 2022-05-29 NOTE — Telephone Encounter (Signed)
Pt will need a new PA for botox. Pt is scheduled for botox on 06/26/22

## 2022-05-30 ENCOUNTER — Other Ambulatory Visit (HOSPITAL_COMMUNITY): Payer: Self-pay

## 2022-05-30 NOTE — Telephone Encounter (Signed)
Patient Advocate Encounter   Received notification that prior authorization for Botox 200UNIT solution is required.   PA submitted on 05/30/2022 Key [id number] Status is pending       Roland Earl, CPhT Pharmacy Patient Advocate Specialist [facility] Patient Advocate Team Direct Number: [phone]  Fax: [phone]

## 2022-05-30 NOTE — Telephone Encounter (Signed)
BotoxOne benefit verification submitted   Key:  [id number]

## 2022-06-01 ENCOUNTER — Other Ambulatory Visit (HOSPITAL_COMMUNITY): Payer: Self-pay

## 2022-06-01 NOTE — Telephone Encounter (Signed)
Patient Advocate Encounter  Prior Authorization for Botox 200UNIT solution  has been approved.     Effective dates: 05/30/2022 through 04/30/2023 4 visits are approved  Buy and Bill    Roland Earl, CPhT Pharmacy Patient Advocate Specialist [facility] Patient Advocate Team Direct Number: [phone]  Fax: [phone]

## 2022-06-04 NOTE — Telephone Encounter (Signed)
Noted, thank you. Pt scheduled for December.

## 2022-06-15 ENCOUNTER — Other Ambulatory Visit: Payer: Self-pay | Admitting: Neurology

## 2022-06-26 ENCOUNTER — Encounter: Payer: Self-pay | Admitting: Neurology

## 2022-06-26 ENCOUNTER — Ambulatory Visit: Payer: BC Managed Care – PPO | Admitting: Neurology

## 2022-06-26 VITALS — BP 120/70 | HR 77 | Ht 62.0 in | Wt 308.0 lb

## 2022-06-26 DIAGNOSIS — G43911 Migraine, unspecified, intractable, with status migrainosus: Secondary | ICD-10-CM

## 2022-06-26 MED ORDER — ONABOTULINUMTOXINA 200 UNITS IJ SOLR
200.0000 [IU] | Freq: Once | INTRAMUSCULAR | Status: AC
  Administered 2022-06-26: 200 [IU] via INTRAMUSCULAR

## 2022-06-26 NOTE — Progress Notes (Signed)
   BOTOX PROCEDURE NOTE FOR MIGRAINE HEADACHE   HISTORY: Lorraine Hurst is here today for continued Botox injection.  Her last injection was 04/03/22 by me.  Reports more headaches during this last cycle.  Stress, weather change?  Uses Relpax and Ubrelvy as needed.  Seeing me next month for medication follow-up.  Description of procedure:  The patient was placed in a sitting position. The standard protocol was used for Botox as follows, with 5 units of Botox injected at each site:   -Procerus muscle, midline injection  -Corrugator muscle, bilateral injection  -Frontalis muscle, bilateral injection, with 2 sites each side, medial injection was performed in the upper one third of the frontalis muscle, in the region vertical from the medial inferior edge of the superior orbital rim. The lateral injection was again in the upper one third of the forehead vertically above the lateral limbus of the cornea, 1.5 cm lateral to the medial injection site.  -Temporalis muscle injection, 4 sites, bilaterally. The first injection was 3 cm above the tragus of the ear, second injection site was 1.5 cm to 3 cm up from the first injection site in line with the tragus of the ear. The third injection site was 1.5-3 cm forward between the first 2 injection sites. The fourth injection site was 1.5 cm posterior to the second injection site.  -Occipitalis muscle injection, 3 sites, bilaterally. The first injection was done one half way between the occipital protuberance and the tip of the mastoid process behind the ear. The second injection site was done lateral and superior to the first, 1 fingerbreadth from the first injection. The third injection site was 1 fingerbreadth superiorly and medially from the first injection site.  -Cervical paraspinal muscle injection, 2 sites, bilateral, the first injection site was 1 cm from the midline of the cervical spine, 3 cm inferior to the lower border of the occipital protuberance. The  second injection site was 1.5 cm superiorly and laterally to the first injection site.  -Trapezius muscle injection was performed at 3 sites, bilaterally. The first injection site was in the upper trapezius muscle halfway between the inflection point of the neck, and the acromion. The second injection site was one half way between the acromion and the first injection site. The third injection was done between the first injection site and the inflection point of the neck.   A 200 unit bottle of Botox was used, 155 units were injected, the rest of the Botox was wasted. The patient tolerated the procedure well, there were no complications of the above procedure.  Botox NDC 1518-3437-35 Lot number [id number] Expiration date 03/2024 BB

## 2022-06-26 NOTE — Progress Notes (Signed)
Botox 200 units x 1 vial [id number] Exp-03/2024 B/B

## 2022-07-12 ENCOUNTER — Telehealth: Payer: Self-pay

## 2022-07-12 NOTE — Telephone Encounter (Signed)
PA sent  (Key: [id number])  Your information has been submitted to [facility] [place] [place]. [employer] [place] will review the request and notify you of the determination decision directly, typically within 72 hours of receiving all information.  You will also receive your request decision electronically. To check for an update later, open this request again from your dashboard.  If [employer] [place] has not responded within the specified timeframe or if you have any questions about your PA submission, contact [employer] [place] directly at [phone].

## 2022-07-17 ENCOUNTER — Ambulatory Visit: Payer: BC Managed Care – PPO | Admitting: Neurology

## 2022-07-31 ENCOUNTER — Encounter: Payer: Self-pay | Admitting: Neurology

## 2022-07-31 ENCOUNTER — Ambulatory Visit (INDEPENDENT_AMBULATORY_CARE_PROVIDER_SITE_OTHER): Payer: BC Managed Care – PPO | Admitting: Neurology

## 2022-07-31 ENCOUNTER — Ambulatory Visit: Payer: BC Managed Care – PPO | Admitting: Neurology

## 2022-07-31 VITALS — BP 142/79 | HR 75 | Ht 62.0 in | Wt 260.0 lb

## 2022-07-31 DIAGNOSIS — G43911 Migraine, unspecified, intractable, with status migrainosus: Secondary | ICD-10-CM

## 2022-07-31 DIAGNOSIS — G40209 Localization-related (focal) (partial) symptomatic epilepsy and epileptic syndromes with complex partial seizures, not intractable, without status epilepticus: Secondary | ICD-10-CM | POA: Diagnosis not present
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
## 2022-07-31 NOTE — Patient Instructions (Signed)
We will check labs today  EEG did not show any seizure activity

## 2022-08-02 ENCOUNTER — Encounter: Payer: Self-pay | Admitting: Neurology

## 2022-08-02 ENCOUNTER — Telehealth: Payer: Self-pay | Admitting: Neurology

## 2022-08-02 LAB — LACOSAMIDE: Lacosamide: 4.4 ug/mL — ABNORMAL LOW (ref 5.0–10.0)

## 2022-08-02 LAB — TOPIRAMATE LEVEL: Topiramate Lvl: 9.2 ug/mL (ref 2.0–25.0)

## 2022-08-02 LAB — LAMOTRIGINE LEVEL: Lamotrigine Lvl: 6.9 ug/mL (ref 2.0–20.0)

## 2022-08-02 NOTE — Telephone Encounter (Signed)
I have called throughout the day, went straight to voicemail, I left her message.  To review lab work.  Lamictal level was 6.9, Vimpat level 4.4, Topamax level 9.2.  Slightly discussed seizures, options for addition of medications (Increase Lamictal, additional of Onfi, consider VNS options with her). I will send a my chart message.

## 2022-08-08 NOTE — Telephone Encounter (Signed)
I tried to call the patient to discuss labs, I left a message.  Asked her to call us back.

## 2022-08-15 ENCOUNTER — Other Ambulatory Visit: Payer: Self-pay | Admitting: Neurology

## 2022-08-15 NOTE — Procedures (Signed)
   HISTORY:  TECHNIQUE:  This is a routine 16 channel EEG recording with one channel devoted to a limited EKG recording.  It was performed during wakefulness, drowsiness and asleep.  Hyperventilation and photic stimulation were performed as activating procedures.  There are frequent bilateral frontal muscle artifact.  Upon maximum arousal, posterior dominant waking rhythm consistent of rhythmic alpha range activity. Activities are symmetric over the bilateral posterior derivations and attenuated with eye opening.  Hyperventilation produced increased muscle artifact, otherwise no significant change in tracing.  Photic stimulation did not alter the tracing.  During EEG recording, patient developed drowsiness and no deep stage of sleep was achieved.  During EEG recording, there was no epileptiform discharge noted.  EKG demonstrate normal sinus rhythm.  CONCLUSION: This is a  normal awake EEG.  There is no electrodiagnostic evidence of epileptiform discharge.  Marcial Pacas, M.D. Ph.D.  [facility] [place], [place] [address] Phone: [phone] Fax:      [phone]

## 2022-08-23 ENCOUNTER — Telehealth: Payer: Self-pay | Admitting: Neurology

## 2022-08-23 NOTE — Telephone Encounter (Signed)
Pt called stating that she has been diagnosed with Covid and was given Lagevrio and is wanting to know if this medication will interact with her other medication like her seizure medications. Please advise.

## 2022-08-23 NOTE — Telephone Encounter (Signed)
I do not see any interaction listed with Lagevrio and Vimpat/Topamax/Lamictal.

## 2022-08-24 NOTE — Telephone Encounter (Signed)
Late Entry-  I spoke with the pt yesterday at 505pm and updated on NPs message/recommendation.  She verbalized understanding and appreciation for the call.

## 2022-09-18 ENCOUNTER — Ambulatory Visit
Admission: RE | Admit: 2022-09-18 | Discharge: 2022-09-18 | Disposition: A | Discharge status: Home / Self Care | Payer: BC Managed Care – PPO | Source: Ambulatory Visit | Attending: Family Medicine | Admitting: Family Medicine

## 2022-09-18 ENCOUNTER — Other Ambulatory Visit: Payer: Self-pay | Admitting: Family Medicine

## 2022-09-18 DIAGNOSIS — M79604 Pain in right leg: Secondary | ICD-10-CM | POA: Insufficient documentation

## 2022-09-18 DIAGNOSIS — I8391 Asymptomatic varicose veins of right lower extremity: Secondary | ICD-10-CM | POA: Diagnosis present

## 2022-09-19 ENCOUNTER — Ambulatory Visit: Payer: BC Managed Care – PPO | Admitting: Neurology

## 2022-10-10 ENCOUNTER — Encounter: Payer: Self-pay | Admitting: Neurology

## 2022-10-10 ENCOUNTER — Ambulatory Visit: Payer: BC Managed Care – PPO | Admitting: Neurology

## 2022-10-10 VITALS — BP 120/69 | HR 69 | Ht 62.0 in | Wt 254.5 lb

## 2022-10-10 DIAGNOSIS — G43911 Migraine, unspecified, intractable, with status migrainosus: Secondary | ICD-10-CM | POA: Diagnosis not present

## 2022-10-10 MED ORDER — ONABOTULINUMTOXINA 200 UNITS IJ SOLR
155.0000 [IU] | Freq: Once | INTRAMUSCULAR | Status: AC
  Administered 2022-10-10: 200 [IU] via INTRAMUSCULAR

## 2022-10-10 NOTE — Progress Notes (Signed)
   BOTOX PROCEDURE NOTE FOR MIGRAINE HEADACHE   HISTORY: Last Botox was 06/26/22 by me.  She is a little bit overdue for Botox.  Has had more headaches.  Botox continues to be quite helpful.  She was recently diagnosed with a DVT to the right leg is on Eliquis for 2 months.  Description of procedure:  The patient was placed in a sitting position. The standard protocol was used for Botox as follows, with 5 units of Botox injected at each site:   -Procerus muscle, midline injection  -Corrugator muscle, bilateral injection  -Frontalis muscle, bilateral injection, with 2 sites each side, medial injection was performed in the upper one third of the frontalis muscle, in the region vertical from the medial inferior edge of the superior orbital rim. The lateral injection was again in the upper one third of the forehead vertically above the lateral limbus of the cornea, 1.5 cm lateral to the medial injection site.  -Temporalis muscle injection, 4 sites, bilaterally. The first injection was 3 cm above the tragus of the ear, second injection site was 1.5 cm to 3 cm up from the first injection site in line with the tragus of the ear. The third injection site was 1.5-3 cm forward between the first 2 injection sites. The fourth injection site was 1.5 cm posterior to the second injection site.  -Occipitalis muscle injection, 3 sites, bilaterally. The first injection was done one half way between the occipital protuberance and the tip of the mastoid process behind the ear. The second injection site was done lateral and superior to the first, 1 fingerbreadth from the first injection. The third injection site was 1 fingerbreadth superiorly and medially from the first injection site.  -Cervical paraspinal muscle injection, 2 sites, bilateral, the first injection site was 1 cm from the midline of the cervical spine, 3 cm inferior to the lower border of the occipital protuberance. The second injection site was 1.5 cm  superiorly and laterally to the first injection site.  -Trapezius muscle injection was performed at 3 sites, bilaterally. The first injection site was in the upper trapezius muscle halfway between the inflection point of the neck, and the acromion. The second injection site was one half way between the acromion and the first injection site. The third injection was done between the first injection site and the inflection point of the neck.   A 200 unit bottle of Botox was used, 155 units were injected, the rest of the Botox was wasted. The patient tolerated the procedure well, there were no complications of the above procedure.  Botox NDC [id number] Lot number [id number] Expiration date 12/2024 BB

## 2022-10-10 NOTE — Progress Notes (Signed)
Botox- 200 units x 1 vial Lot: [id number] Expiration: 06.2026 NDC: 0023-3921-02  Bacteriostatic 0.9% Sodium Chloride- 4 mL total Lot: [id number] Expiration: 11.25 NDC: [id number]  Dx: g43.911 B/B Witnessed by candace, CMA

## 2022-10-16 ENCOUNTER — Other Ambulatory Visit: Payer: Self-pay | Admitting: Neurology

## 2022-11-14 ENCOUNTER — Other Ambulatory Visit: Payer: Self-pay | Admitting: Neurology

## 2022-12-16 ENCOUNTER — Telehealth: Payer: Self-pay

## 2022-12-16 ENCOUNTER — Other Ambulatory Visit (HOSPITAL_COMMUNITY): Payer: Self-pay

## 2022-12-16 NOTE — Telephone Encounter (Signed)
Pharmacy Patient Advocate Encounter   Received notification from [facility] that prior authorization for Ubrelvy 100MG  tablets is required/requested.   PA submitted on 12/16/2022 to (ins) [employer] via CoverMyMeds Key or (Medicaid) confirmation # [id number] Status is pending

## 2022-12-17 ENCOUNTER — Other Ambulatory Visit (HOSPITAL_COMMUNITY): Payer: Self-pay

## 2022-12-17 NOTE — Telephone Encounter (Signed)
Pharmacy Patient Advocate Encounter  Prior Authorization for Ubrelvy 100MG  tablets has been approved by [employer]  (ins).    PA # PA Case ID: [id number] Effective dates: 12/16/2022 through 12/16/2023

## 2023-01-02 ENCOUNTER — Ambulatory Visit: Payer: BC Managed Care – PPO | Admitting: Neurology

## 2023-01-09 ENCOUNTER — Ambulatory Visit: Payer: BC Managed Care – PPO | Admitting: Urology

## 2023-01-09 ENCOUNTER — Encounter: Payer: Self-pay | Admitting: Urology

## 2023-01-09 VITALS — BP 94/60 | HR 88 | Ht 61.0 in | Wt 250.0 lb

## 2023-01-09 DIAGNOSIS — R3129 Other microscopic hematuria: Secondary | ICD-10-CM

## 2023-01-09 DIAGNOSIS — R319 Hematuria, unspecified: Secondary | ICD-10-CM

## 2023-01-09 NOTE — Patient Instructions (Signed)

## 2023-01-09 NOTE — Progress Notes (Signed)
I, [name], acting as a Neurosurgeon for Riki Altes, MD., have documented all relevant documentation on the behalf of Riki Altes, MD,a s directed by  Riki Altes, MD while in the presence of Riki Altes, MD.   01/09/2023 5:09 PM   Lorraine Hurst Dec 01, 1965 [id number]  Referring provider: Danella Penton, MD [id number] [facility] [address] [place] [facility] [place],  [place] [address]  Chief Complaint  Patient presents with   Hematuria    HPI: Lorraine Hurst is a 57 y.o. female referred for evaluation of microhematuria.  Urinalysis 12/04/2022 showed 2+blood on dipstick and 83 RBCs on microscopy. Follow up urinalysis 12/11/22 showed 172 RBCs on microscopy and no WBCs. Denies gross hematuria. No bothersome LUTS, including frequency, urgencym or dysuria. No flank, abdominal, or pelvic pain. No previous history of urologic problems or prior urologic evaluation. Denies previous or current tobacco history.    PMH: Past Medical History:  Diagnosis Date   Acute DVT (deep venous thrombosis) (HCC)    Anemia    Arthritis    Chronic back pain    Chronic nausea    Clotting disorder (HCC)    Fibromyalgia    History of DVT (deep vein thrombosis)    right leg   Hyperlipemia    Migraine    Murmur    Right shoulder pain    Seizures (HCC)     Surgical History: Past Surgical History:  Procedure Laterality Date   IVC FILTER INSERTION     LAPAROSCOPIC GASTRIC BANDING     LEG SURGERY     SUBDURAL HEMATOMA EVACUATION VIA CRANIOTOMY     age 51    Home Medications:  Allergies as of 01/09/2023   No Known Allergies      Medication List        Accurate as of January 09, 2023  5:09 PM. If you have any questions, ask your nurse or doctor.          STOP taking these medications    apixaban 5 MG Tabs tablet Commonly known as: ELIQUIS Stopped by: Riki Altes, MD       TAKE these medications    BIOTIN PO Take 5,000 mcg by mouth daily.    Botox 100 units Solr injection Generic drug: botulinum toxin Type A Inject 200 Units into the muscle every 3 (three) months.   clonazePAM 1 MG tablet Commonly known as: KLONOPIN Take 1 mg by mouth 2 (two) times daily.   cyanocobalamin 1000 MCG tablet Commonly known as: VITAMIN B12 Take 5,000 mcg by mouth daily.   diclofenac sodium 1 % Gel Commonly known as: VOLTAREN Apply 2 g topically daily as needed (pain).   DULoxetine 60 MG capsule Commonly known as: CYMBALTA Take 60 mg by mouth daily.   DULoxetine 30 MG capsule Commonly known as: CYMBALTA Take 30 mg by mouth at bedtime.   eletriptan 40 MG tablet Commonly known as: Relpax Take 1 tablet (40 mg total) by mouth as needed for migraine or headache. May repeat in 2 hours if headache persists or recurs.   ferrous sulfate 325 (65 FE) MG tablet Take 325 mg by mouth daily.   FISH OIL PO Take 1 tablet by mouth daily.   HYDROcodone-acetaminophen 5-325 MG tablet Commonly known as: NORCO/VICODIN Take 1 tablet by mouth every 6 (six) hours as needed for moderate pain.   lacosamide 200 MG Tabs tablet Commonly known as: VIMPAT TAKE 1 TABLET BY MOUTH TWICE  DAILY   lamoTRIgine 200 MG tablet Commonly known as: LAMICTAL 2 tabs in am, 3 tabs at pm   magnesium oxide 400 MG tablet Commonly known as: MAG-OX Take 400 mg by mouth daily.   Multi-Vitamin tablet Take 1 tablet by mouth daily.   nabumetone 750 MG tablet Commonly known as: RELAFEN Take 750 mg by mouth 2 (two) times daily.   ondansetron 8 MG tablet Commonly known as: ZOFRAN Take 8 mg by mouth every 8 (eight) hours as needed for vomiting or nausea.   pantoprazole 40 MG tablet Commonly known as: PROTONIX Take 40 mg by mouth daily as needed (acid reflux).   PREDNISONE PO Take by mouth daily as needed.   SUMAtriptan 100 MG tablet Commonly known as: IMITREX Take by mouth.   tiZANidine 4 MG tablet Commonly known as: ZANAFLEX Take 4 mg by mouth once.    topiramate 100 MG tablet Commonly known as: TOPAMAX TAKE 2 TABLETS BY MOUTH 2 TIMES DAILY   traMADol 50 MG tablet Commonly known as: ULTRAM Take 50 mg by mouth every 6 (six) hours as needed for moderate pain.   Ubrelvy 100 MG Tabs Generic drug: Ubrogepant TAKE ONE TABLET BY MOUTH AS NEEDED         Family History: Family History  Problem Relation Age of Onset   Healthy Mother    Leukemia Father        died at age 85    Social History:  reports that she has never smoked. She has never used smokeless tobacco. She reports that she does not drink alcohol and does not use drugs.   Physical Exam: BP 94/60   Pulse 88   Ht 5\' 1"  (1.549 m)   Wt 250 lb (113.4 kg)   LMP 04/22/2015 (Approximate)   BMI 47.24 kg/m   Constitutional:  Alert and oriented, No acute distress. HEENT: [place] AT, moist mucus membranes.  Trachea midline, no masses. Cardiovascular: No clubbing, cyanosis, or edema. Respiratory: Normal respiratory effort, no increased work of breathing. GI: Abdomen is soft, nontender, nondistended, no abdominal masses Skin: No rashes, bruises or suspicious lesions. Neurologic: Grossly intact, no focal deficits, moving all 4 extremities. Psychiatric: Normal mood and affect.   Assessment & Plan:    1.  Microhematuria AUA risk stratification: High We discussed the recommended evaluation of high risk hematuria which consist of CT urogram and cystoscopy.  The procedures were discussed in detail and he/she has elected to proceed with further evaluation All questions were answered CTU order placed and cystoscopy was scheduled      [facility] [address], [address] [place], [place] [address] [phone]

## 2023-01-19 ENCOUNTER — Other Ambulatory Visit (HOSPITAL_COMMUNITY): Payer: Self-pay

## 2023-01-22 ENCOUNTER — Ambulatory Visit
Admission: RE | Admit: 2023-01-22 | Discharge: 2023-01-22 | Disposition: A | Discharge status: Home / Self Care | Payer: BC Managed Care – PPO | Source: Ambulatory Visit | Attending: Urology | Admitting: Urology

## 2023-01-22 DIAGNOSIS — R3129 Other microscopic hematuria: Secondary | ICD-10-CM | POA: Diagnosis present

## 2023-01-22 MED ORDER — IOHEXOL 350 MG/ML SOLN
100.0000 mL | Freq: Once | INTRAVENOUS | Status: AC | PRN
  Administered 2023-01-22: 100 mL via INTRAVENOUS

## 2023-02-04 ENCOUNTER — Ambulatory Visit
Admission: RE | Admit: 2023-02-04 | Discharge: 2023-02-04 | Disposition: A | Discharge status: Home / Self Care | Payer: BC Managed Care – PPO | Source: Ambulatory Visit | Attending: Family Medicine | Admitting: Family Medicine

## 2023-02-04 ENCOUNTER — Other Ambulatory Visit: Payer: Self-pay | Admitting: Family Medicine

## 2023-02-04 DIAGNOSIS — M79604 Pain in right leg: Secondary | ICD-10-CM

## 2023-02-04 DIAGNOSIS — I809 Phlebitis and thrombophlebitis of unspecified site: Secondary | ICD-10-CM

## 2023-02-04 DIAGNOSIS — M79605 Pain in left leg: Secondary | ICD-10-CM | POA: Insufficient documentation

## 2023-02-12 ENCOUNTER — Other Ambulatory Visit: Payer: Self-pay | Admitting: Neurology

## 2023-02-13 ENCOUNTER — Encounter: Payer: Self-pay | Admitting: Urology

## 2023-02-13 ENCOUNTER — Ambulatory Visit: Payer: BC Managed Care – PPO | Admitting: Urology

## 2023-02-13 VITALS — BP 112/31 | HR 93 | Ht 61.0 in | Wt 250.0 lb

## 2023-02-13 DIAGNOSIS — N200 Calculus of kidney: Secondary | ICD-10-CM

## 2023-02-13 DIAGNOSIS — R3129 Other microscopic hematuria: Secondary | ICD-10-CM | POA: Diagnosis not present

## 2023-02-13 LAB — URINALYSIS, COMPLETE
Bilirubin, UA: NEGATIVE
Glucose, UA: NEGATIVE
Leukocytes,UA: NEGATIVE
Nitrite, UA: NEGATIVE
Specific Gravity, UA: 1.025 (ref 1.005–1.030)
Urobilinogen, Ur: 1 mg/dL (ref 0.2–1.0)
pH, UA: 5 (ref 5.0–7.5)

## 2023-02-13 LAB — MICROSCOPIC EXAMINATION

## 2023-02-13 NOTE — Progress Notes (Signed)
   02/13/23  CC:  Chief Complaint  Patient presents with   Cysto    HPI: Refer to my prior office note 01/09/2023.  CTU performed 01/22/2023 showed bilateral, nonobstructing renal calculi largest measuring 4 mm.  On my review there is a punctate upper pole right renal calculus and a 3 mm right lower pole calculus.  I do not see any left-sided renal calculi  Blood pressure (!) 112/31, pulse 93, height 5\' 1"  (1.549 m), weight 250 lb (113.4 kg), last menstrual period 04/22/2015. NED. A&Ox3.   No respiratory distress   Abd soft, NT, ND Normal external genitalia with patent urethral meatus  Cystoscopy Procedure Note  Patient identification was confirmed, informed consent was obtained, and patient was prepped using Betadine solution.  Lidocaine jelly was administered per urethral meatus.    Procedure: - Flexible cystoscope introduced, without any difficulty.   - Thorough search of the bladder revealed:    normal urethral meatus    normal urothelium    no stones    no ulcers     no tumors    no urethral polyps    no trabeculation  - Ureteral orifices were normal in position and appearance.  Post-Procedure: - Patient tolerated the procedure well  Assessment/ Plan: CT with right nephrolithiasis She was provided stone prevention literature and recommend a follow-up 70-month office visit with KUB No bladder mucosal abnormalities on cystoscopy    Riki Altes, MD

## 2023-02-13 NOTE — Patient Instructions (Signed)

## 2023-02-13 NOTE — Telephone Encounter (Signed)
Requested Prescriptions   Pending Prescriptions Disp Refills   topiramate (TOPAMAX) 100 MG tablet [Pharmacy Med Name: TOPIRAMATE 100 MG TAB] 360 tablet 0    Sig: TAKE 2 TABLETS BY MOUTH 2 TIMES DAILY   lacosamide (VIMPAT) 200 MG TABS tablet [Pharmacy Med Name: LACOSAMIDE 200 MG TAB] 180 tablet     Sig: TAKE 1 TABLET BY MOUTH TWICE DAILY   Last seen 10/10/22, next appt scheduled 02/14/23.   Dispenses   Should be due for refill Dispensed Days Supply Quantity Provider Pharmacy  LACOSAMIDE 200 MG TABS 01/11/2023 30 60 tablet Glean Salvo, NP [facility] - ...  LACOSAMIDE 200 MG TABS 12/12/2022 30 60 tablet Glean Salvo, NP [facility] - ...  LACOSAMIDE 200 MG TAB 11/14/2022 30 60 each Glean Salvo, NP [facility] - ...  LACOSAMIDE 200 MG TAB 10/16/2022 30 60 each Glean Salvo, NP [facility] - ...  LACOSAMIDE 200 MG TABS 09/17/2022 30 60 tablet Glean Salvo, NP [facility] - ...  LACOSAMIDE 200 MG TAB 08/15/2022 30 60 each [name], MD [facility] - ...  LACOSAMIDE 200 MG TABS 07/10/2022 30 60 tablet [name], MD [facility] - ...  LACOSAMIDE 200 MG TAB 06/15/2022 30 60 each [name], MD [facility] - ...  LACOSAMIDE 200 MG TAB 05/14/2022 30 60 each [name], MD [facility] - ...  LACOSAMIDE 200 MG TAB 04/13/2022 30 60 each [name], MD [facility] - ...  LACOSAMIDE 200 MG TAB 03/15/2022 30 60 each [name], MD [facility] - .Marland KitchenMarland Kitchen

## 2023-02-14 ENCOUNTER — Ambulatory Visit: Payer: BC Managed Care – PPO | Admitting: Neurology

## 2023-02-14 ENCOUNTER — Encounter: Payer: Self-pay | Admitting: Neurology

## 2023-02-14 VITALS — BP 118/80 | HR 82 | Ht 61.0 in | Wt 242.5 lb

## 2023-02-14 DIAGNOSIS — G43911 Migraine, unspecified, intractable, with status migrainosus: Secondary | ICD-10-CM | POA: Diagnosis not present

## 2023-02-14 MED ORDER — ONABOTULINUMTOXINA 200 UNITS IJ SOLR
155.0000 [IU] | Freq: Once | INTRAMUSCULAR | Status: AC
Start: 2023-02-14 — End: 2023-02-14
  Administered 2023-02-14: 155 [IU] via INTRAMUSCULAR

## 2023-02-14 NOTE — Progress Notes (Signed)
   BOTOX PROCEDURE NOTE FOR MIGRAINE HEADACHE  HISTORY: Here for Botox, last was 10/10/2022 by me.  She is 1 month overdue. Is off Eliquis now for blood clots. Botox has worn off. Daily headaches since. Uses Ubrelvy or Relpax, tried to alternate.   Description of procedure:  The patient was placed in a sitting position. The standard protocol was used for Botox as follows, with 5 units of Botox injected at each site:  -Procerus muscle, midline injection  -Corrugator muscle, bilateral injection  -Frontalis muscle, bilateral injection, with 2 sites each side, medial injection was performed in the upper one third of the frontalis muscle, in the region vertical from the medial inferior edge of the superior orbital rim. The lateral injection was again in the upper one third of the forehead vertically above the lateral limbus of the cornea, 1.5 cm lateral to the medial injection site.  -Temporalis muscle injection, 4 sites, bilaterally. The first injection was 3 cm above the tragus of the ear, second injection site was 1.5 cm to 3 cm up from the first injection site in line with the tragus of the ear. The third injection site was 1.5-3 cm forward between the first 2 injection sites. The fourth injection site was 1.5 cm posterior to the second injection site.  -Occipitalis muscle injection, 3 sites, bilaterally. The first injection was done one half way between the occipital protuberance and the tip of the mastoid process behind the ear. The second injection site was done lateral and superior to the first, 1 fingerbreadth from the first injection. The third injection site was 1 fingerbreadth superiorly and medially from the first injection site.  -Cervical paraspinal muscle injection, 2 sites, bilateral, the first injection site was 1 cm from the midline of the cervical spine, 3 cm inferior to the lower border of the occipital protuberance. The second injection site was 1.5 cm superiorly and laterally to  the first injection site.  -Trapezius muscle injection was performed at 3 sites, bilaterally. The first injection site was in the upper trapezius muscle halfway between the inflection point of the neck, and the acromion. The second injection site was one half way between the acromion and the first injection site. The third injection was done between the first injection site and the inflection point of the neck.  A 200 unit bottle of Botox was used, 155 units were injected, the rest of the Botox was wasted. The patient tolerated the procedure well, there were no complications of the above procedure.  Botox NDC 6295-2841-32 Lot number [id number] Expiration date 06/22/2025 SP  I would like for her to come for dedicated visit to discuss her seizures. On Topamax, Vimpat, Lamictal. Reports 2 events since last seen. We talked about increasing her medication, wants to leave as is, has continued with seizures.

## 2023-02-14 NOTE — Progress Notes (Addendum)
Botox- 200 units x 1 vial Lot: [id number] Expiration: 12.2026 NDC: 0023-3921-02  Bacteriostatic 0.9% Sodium Chloride- 4 mL  Lot: [id number] Expiration: 11.01.2025 NDC: [id number]  Dx: g43.709 B/B Witnessed by Cinda Quest, CMA

## 2023-03-28 ENCOUNTER — Ambulatory Visit: Payer: BC Managed Care – PPO | Admitting: Neurology

## 2023-03-28 ENCOUNTER — Encounter: Payer: Self-pay | Admitting: Neurology

## 2023-03-28 VITALS — BP 126/80 | HR 82 | Ht 61.0 in | Wt 248.5 lb

## 2023-03-28 DIAGNOSIS — R413 Other amnesia: Secondary | ICD-10-CM | POA: Diagnosis not present

## 2023-03-28 DIAGNOSIS — Z9889 Other specified postprocedural states: Secondary | ICD-10-CM | POA: Diagnosis not present

## 2023-03-28 DIAGNOSIS — G43911 Migraine, unspecified, intractable, with status migrainosus: Secondary | ICD-10-CM

## 2023-03-28 DIAGNOSIS — G40209 Localization-related (focal) (partial) symptomatic epilepsy and epileptic syndromes with complex partial seizures, not intractable, without status epilepticus: Secondary | ICD-10-CM | POA: Diagnosis not present

## 2023-03-28 MED ORDER — LACOSAMIDE 200 MG PO TABS: 200.0000 mg | ORAL_TABLET | Freq: Two times a day (BID) | ORAL | 3 refills | Status: DC

## 2023-03-28 MED ORDER — SUMATRIPTAN SUCCINATE 100 MG PO TABS: ORAL_TABLET | ORAL | 11 refills | Status: DC

## 2023-03-28 MED ORDER — UBRELVY 100 MG PO TABS: 1.0000 | ORAL_TABLET | ORAL | 11 refills | Status: DC | PRN

## 2023-03-28 MED ORDER — TOPIRAMATE 100 MG PO TABS: 200.0000 mg | ORAL_TABLET | Freq: Two times a day (BID) | ORAL | 3 refills | Status: DC

## 2023-03-28 MED ORDER — LAMOTRIGINE 200 MG PO TABS: ORAL_TABLET | ORAL | 4 refills | Status: DC
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
## 2023-04-29 NOTE — Telephone Encounter (Signed)
Completed [employer] [place] continuation form and placed in nurse pod for NP signature.

## 2023-04-30 NOTE — Telephone Encounter (Signed)
Faxed signed continuation form with OV notes to [facility] @ [phone].

## 2023-05-06 ENCOUNTER — Telehealth: Payer: Self-pay | Admitting: Neurology

## 2023-05-06 NOTE — Telephone Encounter (Signed)
Called and spoke with pt's husband Casimiro Needle. Her insurance is pulling up as inactive and I need her new information before her 10/22 appt. Casimiro Needle states he will have pt call us back when she wakes up.

## 2023-05-06 NOTE — Telephone Encounter (Signed)
Tried reaching out about insurance again, LVM asking pt to call back and give [place] new information.

## 2023-05-07 NOTE — Telephone Encounter (Signed)
Called and spoke with pt's husband Casimiro Needle. He states they are working on their new insurance now and will call back later today with the information.

## 2023-05-13 NOTE — Telephone Encounter (Signed)
Called and spoke with pt's husband Casimiro Needle. He states the issue with their insurance has been fixed but he still does not have the new information. We rescheduled pt's appt for 11/6 @ 8:15 am. He states he should have insurance info within the next few days and will call us as soon as he has it.

## 2023-05-14 ENCOUNTER — Ambulatory Visit: Payer: Self-pay | Admitting: Neurology

## 2023-05-22 NOTE — Telephone Encounter (Signed)
Pt's insurance is now updated in our system. I completed BCBS continuation form and faxed with OV notes to [phone].

## 2023-05-23 NOTE — Telephone Encounter (Signed)
PA was approved by [facility] for buy/bill. Ref# [id number] (05/01/23-04/01/24)

## 2023-05-29 ENCOUNTER — Ambulatory Visit (INDEPENDENT_AMBULATORY_CARE_PROVIDER_SITE_OTHER): Payer: BC Managed Care – PPO | Admitting: Neurology

## 2023-05-29 VITALS — BP 136/83

## 2023-05-29 DIAGNOSIS — G43911 Migraine, unspecified, intractable, with status migrainosus: Secondary | ICD-10-CM

## 2023-05-29 MED ORDER — ONABOTULINUMTOXINA 200 UNITS IJ SOLR
155.0000 [IU] | Freq: Once | INTRAMUSCULAR | Status: AC
Start: 2023-05-29 — End: 2023-05-29
  Administered 2023-05-29: 155 [IU] via INTRAMUSCULAR

## 2023-05-29 NOTE — Progress Notes (Signed)
   BOTOX PROCEDURE NOTE FOR MIGRAINE HEADACHE  HISTORY: Lorraine Hurst is here for Botox. Last was 02/14/23 with me.  Has had some sharp left sided occipital ice pick headache. Lately has had daily migraines since Botox has worn off.  Uses Imitrex and Ubrelvy as rescue, doesn't use all of supply in good cycle of Botox. With Botox only 2 migraines weekly!   Description of procedure:  The patient was placed in a sitting position. The standard protocol was used for Botox as follows, with 5 units of Botox injected at each site:   -Procerus muscle, midline injection  -Corrugator muscle, bilateral injection  -Frontalis muscle, bilateral injection, with 2 sites each side, medial injection was performed in the upper one third of the frontalis muscle, in the region vertical from the medial inferior edge of the superior orbital rim. The lateral injection was again in the upper one third of the forehead vertically above the lateral limbus of the cornea, 1.5 cm lateral to the medial injection site.  -Temporalis muscle injection, 4 sites, bilaterally. The first injection was 3 cm above the tragus of the ear, second injection site was 1.5 cm to 3 cm up from the first injection site in line with the tragus of the ear. The third injection site was 1.5-3 cm forward between the first 2 injection sites. The fourth injection site was 1.5 cm posterior to the second injection site.  -Occipitalis muscle injection, 3 sites, bilaterally. The first injection was done one half way between the occipital protuberance and the tip of the mastoid process behind the ear. The second injection site was done lateral and superior to the first, 1 fingerbreadth from the first injection. The third injection site was 1 fingerbreadth superiorly and medially from the first injection site.  -Cervical paraspinal muscle injection, 2 sites, bilateral, the first injection site was 1 cm from the midline of the cervical spine, 3 cm inferior to the lower  border of the occipital protuberance. The second injection site was 1.5 cm superiorly and laterally to the first injection site.  -Trapezius muscle injection was performed at 3 sites, bilaterally. The first injection site was in the upper trapezius muscle halfway between the inflection point of the neck, and the acromion. The second injection site was one half way between the acromion and the first injection site. The third injection was done between the first injection site and the inflection point of the neck.   A 200 unit bottle of Botox was used, 155 units were injected, the rest of the Botox was wasted. The patient tolerated the procedure well, there were no complications of the above procedure.  Botox NDC 1610-9604-54 Lot number [id number] Expiration date 09/2025 BB

## 2023-05-29 NOTE — Progress Notes (Signed)
Botox- 200 units x 1 vial Lot: [id number] Expiration: 536644 NDC: 0023-3921-02  Bacteriostatic 0.9% Sodium Chloride- 4 mL  Lot: [id number] Expiration: 04.01.2024 NDC: 0347425956  Dx: L87.564 B/B Witnessed by Cinda Quest, CMA

## 2023-07-15 ENCOUNTER — Telehealth: Payer: Self-pay | Admitting: Neurology

## 2023-07-15 NOTE — Telephone Encounter (Signed)
Pt called and LVM wanting to cx an upcoming appt. Pt stated on her VM that she has an appt in Dec. But appt in chart is at the end of January.

## 2023-08-16 ENCOUNTER — Ambulatory Visit: Payer: BC Managed Care – PPO | Admitting: Urology

## 2023-08-21 ENCOUNTER — Ambulatory Visit: Payer: BC Managed Care – PPO | Admitting: Neurology

## 2023-08-28 ENCOUNTER — Ambulatory Visit: Payer: BC Managed Care – PPO | Admitting: Neurology

## 2023-08-28 DIAGNOSIS — G43911 Migraine, unspecified, intractable, with status migrainosus: Secondary | ICD-10-CM | POA: Diagnosis not present

## 2023-08-28 MED ORDER — QULIPTA 60 MG PO TABS: 60.0000 mg | ORAL_TABLET | Freq: Every day | ORAL | 11 refills | Status: DC

## 2023-08-28 MED ORDER — ONABOTULINUMTOXINA 200 UNITS IJ SOLR
155.0000 [IU] | Freq: Once | INTRAMUSCULAR | Status: AC
Start: 2023-08-28 — End: 2023-08-28
  Administered 2023-08-28: 155 [IU] via INTRAMUSCULAR

## 2023-08-28 NOTE — Progress Notes (Signed)
 Botox - 200 units x 1 vial Lot: [id number] Expiration: 10/2025 NDC: 3086-5784-69  Bacteriostatic 0.9% Sodium Chloride - * mL  Lot: [id number] Expiration: 05/23/2024 NDC: 4132-4401-02  Dx: V25.366 B/B Witnessed by Ronita Cohens CMA

## 2023-08-28 NOTE — Progress Notes (Signed)
   BOTOX  PROCEDURE NOTE FOR MIGRAINE HEADACHE  HISTORY: Josie is here for Botox . Last was 05/29/23 with me. More migraines this last time. Daily headache, 3 migraines a week. Takes Imitrex  with good benefit, but Ubrelvy  is not as helpful. Remains on Topamax , Lamictal , Vimpat .  What can we add on to help migraines?  Description of procedure:  The patient was placed in a sitting position. The standard protocol was used for Botox  as follows, with 5 units of Botox  injected at each site:   -Procerus muscle, midline injection  -Corrugator muscle, bilateral injection  -Frontalis muscle, bilateral injection, with 2 sites each side, medial injection was performed in the upper one third of the frontalis muscle, in the region vertical from the medial inferior edge of the superior orbital rim. The lateral injection was again in the upper one third of the forehead vertically above the lateral limbus of the cornea, 1.5 cm lateral to the medial injection site.  -Temporalis muscle injection, 4 sites, bilaterally. The first injection was 3 cm above the tragus of the ear, second injection site was 1.5 cm to 3 cm up from the first injection site in line with the tragus of the ear. The third injection site was 1.5-3 cm forward between the first 2 injection sites. The fourth injection site was 1.5 cm posterior to the second injection site.  -Occipitalis muscle injection, 3 sites, bilaterally. The first injection was done one half way between the occipital protuberance and the tip of the mastoid process behind the ear. The second injection site was done lateral and superior to the first, 1 fingerbreadth from the first injection. The third injection site was 1 fingerbreadth superiorly and medially from the first injection site.  -Cervical paraspinal muscle injection, 2 sites, bilateral, the first injection site was 1 cm from the midline of the cervical spine, 3 cm inferior to the lower border of the occipital  protuberance. The second injection site was 1.5 cm superiorly and laterally to the first injection site.  -Trapezius muscle injection was performed at 3 sites, bilaterally. The first injection site was in the upper trapezius muscle halfway between the inflection point of the neck, and the acromion. The second injection site was one half way between the acromion and the first injection site. The third injection was done between the first injection site and the inflection point of the neck.   A 200 unit bottle of Botox  was used, 155 units were injected, the rest of the Botox  was wasted. The patient tolerated the procedure well, there were no complications of the above procedure.  Botox  NDC 9976-6078-97 Lot number [id number] Expiration date 10/2025 BB  Will add on Qulipta  for migraine prevention + continue Botox . May consider Emgality  or Vyepti. Tried and failed: Aimovig , Ajovy , Topamax , Maxalt   Meds ordered this encounter  Medications   botulinum toxin Type A  (BOTOX ) injection 155 Units    Botox - 200 units x 1 vial Lot: [id number] Expiration: 10/2025 NDC: 9976-6078-97  Bacteriostatic 0.9% Sodium Chloride - * mL  Lot: [id number] Expiration: 05/23/2024 NDC: 9590-8033-97  Dx: H56.088 B/B Witnessed by Nevelyn FALCON CMA   Atogepant  (QULIPTA ) 60 MG TABS    Sig: Take 1 tablet (60 mg total) by mouth daily.    Dispense:  30 tablet    Refill:  11    Lauraine Gayland MANDES, [facility]  [facility] [address] [place], [place] [address] [phone]

## 2023-12-03 ENCOUNTER — Ambulatory Visit: Payer: BC Managed Care – PPO | Admitting: Neurology

## 2023-12-03 VITALS — BP 127/86

## 2023-12-03 DIAGNOSIS — G43911 Migraine, unspecified, intractable, with status migrainosus: Secondary | ICD-10-CM | POA: Diagnosis not present

## 2023-12-03 MED ORDER — ONABOTULINUMTOXINA 200 UNITS IJ SOLR
155.0000 [IU] | Freq: Once | INTRAMUSCULAR | Status: AC
Start: 2023-12-03 — End: 2023-12-03
  Administered 2023-12-03: 155 [IU] via INTRAMUSCULAR

## 2023-12-03 MED ORDER — QULIPTA 60 MG PO TABS: 60.0000 mg | ORAL_TABLET | Freq: Every day | ORAL | 11 refills | Status: DC

## 2023-12-03 NOTE — Progress Notes (Signed)
   BOTOX  PROCEDURE NOTE FOR MIGRAINE HEADACHE  HISTORY: Lorraine Hurst is here for Botox . Last was 08/28/23 with me. Claims never heard anything about Qulipta . Having more migraines, daily headache, 3 migraines a week.  Has imitrex  and Ubrelvy  as rescue. Headache so bad almost went to the ER.  Description of procedure:  The patient was placed in a sitting position. The standard protocol was used for Botox  as follows, with 5 units of Botox  injected at each site:   -Procerus muscle, midline injection  -Corrugator muscle, bilateral injection  -Frontalis muscle, bilateral injection, with 2 sites each side, medial injection was performed in the upper one third of the  frontalis muscle, in the region vertical from the medial inferior edge of the superior orbital rim. The lateral injection was again in the upper one third of the forehead vertically above the lateral limbus of the cornea, 1.5 cm lateral to the medial injection site.  -Temporalis muscle injection, 4 sites, bilaterally. The first injection was 3 cm above the tragus of the ear, second injection site was 1.5 cm to 3 cm up from the first injection site in line with the tragus of the ear. The third injection site was 1.5-3 cm forward between the first 2 injection sites. The fourth injection site was 1.5 cm posterior to the second injection site.  -Occipitalis muscle injection, 3 sites, bilaterally. The first injection was done one half way between the occipital protuberance and the tip of the mastoid process behind the ear. The second injection site was done lateral and superior to the first, 1 fingerbreadth from the first injection. The third injection site was 1 fingerbreadth superiorly and medially from the first injection site.  -Cervical paraspinal muscle injection, 2 sites, bilateral, the first injection site was 1 cm from the midline of the cervical spine, 3 cm inferior to the lower border of the occipital protuberance. The second  injection site was 1.5 cm superiorly and laterally to the first injection site.  -Trapezius muscle injection was performed at 3 sites, bilaterally. The first injection site was in the upper trapezius muscle halfway between the inflection point of the neck, and the acromion. The second injection site was one half way between the acromion and the first injection site. The third injection was done between the first injection site and the inflection point of the neck.  A 200 unit bottle of Botox  was used, 155 units were injected, the rest of the Botox  was wasted. The patient tolerated the procedure well, there were no complications of the above procedure.  Botox  NDC 1478-2956-21 Lot number [id number] Expiration date 10/2025 BB  I will reorder Qulipta  for migraine prevention to continue Botox . She needs revisit with Dr. Gracie Lav for seizure management.

## 2023-12-03 NOTE — Progress Notes (Signed)
 Botox - 200 units x 1 vial Lot: [id number] Expiration: 2027/10 NDC: 0023-3921-02  Bacteriostatic 0.9% Sodium Chloride - 4 mL  Lot: [id number] Expiration: 05/23/24 NDC: [id number]  Dx:  I95.188   B/B Witnessed by Alphonza Ashing NP

## 2023-12-06 ENCOUNTER — Other Ambulatory Visit (HOSPITAL_COMMUNITY): Payer: Self-pay

## 2023-12-06 ENCOUNTER — Telehealth: Payer: Self-pay

## 2023-12-06 NOTE — Telephone Encounter (Signed)
 Pharmacy Patient Advocate Encounter  Received notification from [facility] that Prior Authorization for Qulipta  60MG  tablets has been DENIED.  Full denial letter will be uploaded to the media tab. See denial reason below.   PA #/Case ID/Reference #: PA Case ID #: [id number]

## 2023-12-06 NOTE — Telephone Encounter (Signed)
 Pharmacy Patient Advocate Encounter   Received notification from CoverMyMeds that prior authorization for Qulipta  60MG  tablets is required/requested.   Insurance verification completed.   The patient is insured through [facility] .   Per test claim: PA required; PA submitted to above mentioned insurance via CoverMyMeds Key/confirmation #/EOC [id number] Status is pending

## 2023-12-10 NOTE — Telephone Encounter (Signed)
 Resubmitted-if denied again will send for an appeal.  Pharmacy Patient Advocate Encounter   Received notification from Physician's Office that prior authorization for Qulipta  60MG  tablets is required/requested.   Insurance verification completed.   The patient is insured through [facility] .   Per test claim: PA required; PA submitted to above mentioned insurance via CoverMyMeds Key/confirmation #/EOC [id number] Status is pending

## 2023-12-12 MED ORDER — EMGALITY 120 MG/ML ~~LOC~~ SOAJ: 240.0000 mg | SUBCUTANEOUS | 0 refills | Status: DC

## 2023-12-12 MED ORDER — EMGALITY 120 MG/ML ~~LOC~~ SOAJ: 120.0000 mg | SUBCUTANEOUS | 11 refills | Status: DC

## 2023-12-12 NOTE — Telephone Encounter (Signed)
 Called and spoke to [facility] and explained that emgality was sent in for loading dose as well and that pt must try that 1st prior to qulipta 

## 2023-12-12 NOTE — Telephone Encounter (Signed)
 Insurance will not cover Qulipta  unless she tries [employer] Will send in Emgality with loading dose.   Meds ordered this encounter  Medications   Galcanezumab-gnlm (EMGALITY) 120 MG/ML SOAJ    Sig: Inject 120 mg into the skin every 30 (thirty) days.    Dispense:  1.12 mL    Refill:  11   Galcanezumab-gnlm (EMGALITY) 120 MG/ML SOAJ    Sig: Inject 240 mg into the skin every 30 (thirty) days.    Dispense:  2 mL    Refill:  0    Please dispense 2 pens as loading dose. Fill this first please.

## 2023-12-12 NOTE — Telephone Encounter (Signed)
 Pharmacy Patient Advocate Encounter  Received notification from [facility] that Prior Authorization for Qulipta  60MG  tablets has been DENIED.  Full denial letter will be uploaded to the media tab. See denial reason below.   PA #/Case ID/Reference #: PA Case ID #: [id number]

## 2023-12-13 ENCOUNTER — Telehealth: Payer: Self-pay | Admitting: Pharmacist

## 2023-12-13 ENCOUNTER — Other Ambulatory Visit (HOSPITAL_COMMUNITY): Payer: Self-pay

## 2023-12-13 NOTE — Telephone Encounter (Signed)
 Pharmacy Patient Advocate Encounter   Received notification from Patient Pharmacy that prior authorization for Emgality 120MG /ML auto-injectors (migraine) is required/requested.   Insurance verification completed.   The patient is insured through [facility] .   Per test claim: PA required; PA submitted to above mentioned insurance via CoverMyMeds Key/confirmation #/EOC [id number] Status is pending

## 2023-12-13 NOTE — Telephone Encounter (Signed)
 Pharmacy Patient Advocate Encounter  Received notification from [facility] that Prior Authorization for Emgality has been APPROVED from 12/13/2023 to 03/06/2024   PA #/Case ID/Reference #: [id number]

## 2023-12-18 ENCOUNTER — Telehealth: Payer: Self-pay

## 2023-12-18 ENCOUNTER — Other Ambulatory Visit (HOSPITAL_COMMUNITY): Payer: Self-pay

## 2023-12-18 NOTE — Telephone Encounter (Signed)
 Pharmacy Patient Advocate Encounter   Received notification from CoverMyMeds that prior authorization for Ubrelvy  100MG  tablets is required/requested.   Insurance verification completed.   The patient is insured through [facility] .   Per test claim: PA required; PA submitted to above mentioned insurance via CoverMyMeds Key/confirmation #/EOC [facility] Status is pending

## 2023-12-18 NOTE — Telephone Encounter (Signed)
 Pharmacy Patient Advocate Encounter  Received notification from [facility] that Prior Authorization for Ubrelvy  100MG  tablets has been APPROVED from 12/18/2023 to 12/17/2024   PA #/Case ID/Reference #: PA Case ID #: [id number]

## 2023-12-26 ENCOUNTER — Ambulatory Visit: Payer: BC Managed Care – PPO | Admitting: Neurology

## 2024-01-28 ENCOUNTER — Telehealth: Payer: Self-pay | Admitting: Neurology

## 2024-01-28 DIAGNOSIS — G43911 Migraine, unspecified, intractable, with status migrainosus: Secondary | ICD-10-CM

## 2024-01-28 NOTE — Telephone Encounter (Signed)
 Called and spoke with pt's husband Ozell regarding insurance being inactive. He states the issue should be fixed within the next week and that it'll be the same [facility] plan. I will try running it again next week.

## 2024-01-30 NOTE — Telephone Encounter (Signed)
 Pt's insurance is working now. Verified via BCBS portal that previous shara is still current and valid. [place] in billing is resubmitting her previous claim that was denied for inactive plan.

## 2024-02-25 ENCOUNTER — Encounter: Payer: Self-pay | Admitting: Neurology

## 2024-02-25 ENCOUNTER — Ambulatory Visit: Admitting: Neurology

## 2024-02-25 VITALS — BP 122/76 | HR 84

## 2024-02-25 DIAGNOSIS — G43911 Migraine, unspecified, intractable, with status migrainosus: Secondary | ICD-10-CM | POA: Diagnosis not present

## 2024-02-25 MED ORDER — EMGALITY 120 MG/ML ~~LOC~~ SOAJ: 120.0000 mg | SUBCUTANEOUS | 11 refills | Status: AC

## 2024-02-25 MED ORDER — EMGALITY 120 MG/ML ~~LOC~~ SOAJ: 240.0000 mg | SUBCUTANEOUS | 0 refills | Status: AC

## 2024-02-25 MED ORDER — ONABOTULINUMTOXINA 200 UNITS IJ SOLR
155.0000 [IU] | Freq: Once | INTRAMUSCULAR | Status: AC
Start: 2024-02-25 — End: 2024-02-25
  Administered 2024-02-25: 155 [IU] via INTRAMUSCULAR

## 2024-02-25 NOTE — Progress Notes (Signed)
 Botox - 200 units x 1 vial Lot: [id number] Expiration: 05/2026 NDC: 9976-6078-97  Bacteriostatic 0.9% Sodium Chloride -30 mL  Lot: [id number] Expiration: OCT-31-2026 NDC: 9590-8033-97  Dx:G43.911 B/B Witnessed by: Robie Meissner, CMA

## 2024-02-25 NOTE — Progress Notes (Signed)
   BOTOX  PROCEDURE NOTE FOR MIGRAINE HEADACHE  HISTORY: Lorraine Hurst is here for Botox . Last was 12/03/23 with me. Insurance denied Qulipta , required her to try Emgality . Lately migraines worse, daily basis. Triggered by smell from landscaping. Botox  is helpful, less frequent severe migraines.   Description of procedure:  The patient was placed in a sitting position. The standard protocol was used for Botox  as follows, with 5 units of Botox  injected at each site:   -Procerus muscle, midline injection  -Corrugator muscle, bilateral injection  -Frontalis muscle, bilateral injection, with 2 sites each side, medial injection was performed in the upper one third of the frontalis muscle, in the region vertical from the medial inferior edge of the superior orbital rim. The lateral injection was again in the upper one third of the forehead vertically above the lateral limbus of the cornea, 1.5 cm lateral to the medial injection site.  -Temporalis muscle injection, 4 sites, bilaterally. The first injection was 3 cm above the tragus of the ear, second injection site was 1.5 cm to 3 cm up from the first injection site in line with the tragus of the ear. The third injection site was 1.5-3 cm forward between the first 2 injection sites. The fourth injection site was 1.5 cm posterior to the second injection site.  -Occipitalis muscle injection, 3 sites, bilaterally. The first injection was done one half way between the occipital protuberance and the tip of the mastoid process behind the ear. The second injection site was done lateral and superior to the first, 1 fingerbreadth from the first injection. The third injection site was 1 fingerbreadth superiorly and medially from the first injection site.  -Cervical paraspinal muscle injection, 2 sites, bilateral, the first injection site was 1 cm from the midline of the cervical spine, 3 cm inferior to the lower border of the occipital protuberance. The second  injection site was 1.5 cm superiorly and laterally to the first injection site.  -Trapezius muscle injection was performed at 3 sites, bilaterally. The first injection site was in the upper trapezius muscle halfway between the inflection point of the neck, and the acromion. The second injection site was one half way between the acromion and the first injection site. The third injection was done between the first injection site and the inflection point of the neck.   A 200 unit bottle of Botox  was used, 155 units were injected, the rest of the Botox  was wasted. The patient tolerated the procedure well, there were no complications of the above procedure.  Botox  NDC 9976-6078-97 Lot number [id number] Expiration date 05/2026 BB  I will resend Emgality  for migraine prevention.  Meds ordered this encounter  Medications   botulinum toxin Type A  (BOTOX ) injection 155 Units    Lot: [id number], exp: 05/2026, NDC: 9976-6078-97   Galcanezumab -gnlm (EMGALITY ) 120 MG/ML SOAJ    Sig: Inject 240 mg into the skin every 30 (thirty) days.    Dispense:  2 mL    Refill:  0    Please dispense 2 pens as loading dose. Fill this first please.   Galcanezumab -gnlm (EMGALITY ) 120 MG/ML SOAJ    Sig: Inject 120 mg into the skin every 30 (thirty) days.    Dispense:  1.12 mL    Refill:  11

## 2024-02-26 ENCOUNTER — Telehealth: Payer: Self-pay | Admitting: Pharmacist

## 2024-02-26 NOTE — Telephone Encounter (Signed)
 Pharmacy Patient Advocate Encounter   Received notification from Patient Pharmacy that prior authorization for Emgality  120MG /ML auto-injectors (migraine) is required/requested.   Insurance verification completed.   The patient is insured through [facility] .   Per test claim: PA required; PA submitted to above mentioned insurance via CoverMyMeds Key/confirmation #/EOC [id number] Status is pending

## 2024-02-27 ENCOUNTER — Other Ambulatory Visit (HOSPITAL_COMMUNITY): Payer: Self-pay

## 2024-02-27 NOTE — Telephone Encounter (Signed)
 Pharmacy Patient Advocate Encounter  Received notification from [facility] that Prior Authorization for Emgality  120MG /ML auto-injectors (migraine) has been APPROVED from 02/27/2024 to 05/20/2024. Ran test claim, Copay is $35.00. This test claim was processed through [facility]- copay amounts may vary at other pharmacies due to pharmacy/plan contracts, or as the patient moves through the different stages of their insurance plan.   PA #/Case ID/Reference #: [id number]

## 2024-04-09 ENCOUNTER — Telehealth: Payer: Self-pay | Admitting: Neurology

## 2024-04-09 ENCOUNTER — Ambulatory Visit: Admitting: Neurology

## 2024-04-09 ENCOUNTER — Encounter: Payer: Self-pay | Admitting: Neurology

## 2024-04-09 VITALS — BP 136/82 | Ht 60.0 in

## 2024-04-09 DIAGNOSIS — Z9889 Other specified postprocedural states: Secondary | ICD-10-CM | POA: Diagnosis not present

## 2024-04-09 DIAGNOSIS — G40209 Localization-related (focal) (partial) symptomatic epilepsy and epileptic syndromes with complex partial seizures, not intractable, without status epilepticus: Secondary | ICD-10-CM

## 2024-04-09 DIAGNOSIS — R413 Other amnesia: Secondary | ICD-10-CM

## 2024-04-09 DIAGNOSIS — G43911 Migraine, unspecified, intractable, with status migrainosus: Secondary | ICD-10-CM

## 2024-04-09 MED ORDER — UBRELVY 100 MG PO TABS: ORAL_TABLET | ORAL | 11 refills | Status: AC

## 2024-04-09 MED ORDER — LAMOTRIGINE 200 MG PO TABS: ORAL_TABLET | ORAL | 4 refills | Status: AC

## 2024-04-09 MED ORDER — TOPIRAMATE 100 MG PO TABS: 200.0000 mg | ORAL_TABLET | Freq: Two times a day (BID) | ORAL | 3 refills | Status: AC

## 2024-04-09 MED ORDER — LACOSAMIDE 200 MG PO TABS: 200.0000 mg | ORAL_TABLET | Freq: Two times a day (BID) | ORAL | 3 refills | Status: AC

## 2024-04-09 MED ORDER — SUMATRIPTAN SUCCINATE 100 MG PO TABS: ORAL_TABLET | ORAL | 11 refills | Status: AC
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
## 2024-04-09 NOTE — Telephone Encounter (Signed)
Placed for signature 

## 2024-04-09 NOTE — Telephone Encounter (Signed)
 I ordered 24 hours ambulatory eeg,   Last regular eeg was in Sept 2024

## 2024-04-16 ENCOUNTER — Telehealth: Payer: Self-pay

## 2024-04-29 NOTE — Telephone Encounter (Signed)
 Completed BCBS PA form under [facility], faxed with notes to [phone].

## 2024-05-07 ENCOUNTER — Other Ambulatory Visit: Payer: Self-pay

## 2024-05-07 MED ORDER — ONABOTULINUMTOXINA 200 UNITS IJ SOLR
INTRAMUSCULAR | 2 refills | Status: DC
Start: 2024-05-07 — End: 2024-05-11
  Filled 2024-05-07: qty 1, fill #0

## 2024-05-07 NOTE — Addendum Note (Signed)
 Addended by: JOSHUA MAURILIO CROME on: 05/07/2024 03:50 PM   Modules accepted: Orders

## 2024-05-07 NOTE — Telephone Encounter (Signed)
 Received approval, please send rx to [facility] in [place].  Auth#: [id number] (04/29/24-03/31/25)

## 2024-05-07 NOTE — Telephone Encounter (Signed)
 LVM informing pt of change and to expect a call from [facility] for consent.

## 2024-05-08 ENCOUNTER — Other Ambulatory Visit: Payer: Self-pay

## 2024-05-08 ENCOUNTER — Other Ambulatory Visit (HOSPITAL_COMMUNITY): Payer: Self-pay

## 2024-05-08 NOTE — Telephone Encounter (Signed)
 Botox  was sent to [facility] by accident I believe.

## 2024-05-08 NOTE — Progress Notes (Signed)
 Patient must fill with [facility]. Dis-enrolling.

## 2024-05-11 MED ORDER — ONABOTULINUMTOXINA 200 UNITS IJ SOLR
INTRAMUSCULAR | 3 refills | Status: AC
Start: 2024-05-11 — End: ?

## 2024-05-11 NOTE — Addendum Note (Signed)
 Addended by: ONEITA NEVELYN BRAVO on: 05/11/2024 09:13 AM   Modules accepted: Orders

## 2024-05-11 NOTE — Telephone Encounter (Signed)
 Sent to [facility] in [place]

## 2024-05-18 ENCOUNTER — Telehealth: Payer: Self-pay | Admitting: Neurology

## 2024-05-18 NOTE — Telephone Encounter (Signed)
 Gatha from [employer] [employer] called to set up Pt Botox  Delivery   Botox  Delivery Date:05-25-2024 Quantity of 1 Valve 200 units for 30 days supply

## 2024-05-26 ENCOUNTER — Telehealth: Payer: Self-pay | Admitting: Neurology

## 2024-05-26 ENCOUNTER — Ambulatory Visit: Admitting: Neurology

## 2024-05-26 NOTE — Telephone Encounter (Signed)
 Pt's husband left us  a VM asking to r/s today's Botox  appointment, stating that the pt had a bad night. I returned the call and LVM with my direct # for them to call me back to r/s.

## 2024-06-02 ENCOUNTER — Telehealth: Payer: Self-pay | Admitting: Neurology

## 2024-06-02 NOTE — Telephone Encounter (Signed)
 Pt called to cancel appt due  needing to help husband   Appt Canceled

## 2024-06-03 ENCOUNTER — Ambulatory Visit: Admitting: Neurology

## 2024-07-28 ENCOUNTER — Ambulatory Visit: Admitting: Neurology

## 2024-07-28 VITALS — BP 128/86

## 2024-07-28 DIAGNOSIS — G43911 Migraine, unspecified, intractable, with status migrainosus: Secondary | ICD-10-CM

## 2024-07-28 MED ORDER — ONABOTULINUMTOXINA 200 UNITS IJ SOLR
155.0000 [IU] | Freq: Once | INTRAMUSCULAR | Status: AC
  Administered 2024-07-28: 155 [IU] via INTRAMUSCULAR

## 2024-07-28 NOTE — Progress Notes (Signed)
 Botox - 200 units x 1 vial Lot: [id number] Expiration: 2027/12 NDC: 0023-3921-02  Bacteriostatic 0.9% Sodium Chloride - 4 mL  Lot: [id number] Expiration: 10/20/25 NDC: 9590803397  Dx: H56.088  S/P  Witnessed by JINNY ROYS

## 2024-07-28 NOTE — Progress Notes (Signed)
" ° °  BOTOX  PROCEDURE NOTE FOR MIGRAINE HEADACHE   HISTORY: Lorraine Hurst is here for Botox . Last was 02/25/24 with me.  She is overdue for Botox .  Unfortunately her husband passed away in 06-16-2025. Having more headaches.   Description of procedure:  The patient was placed in a sitting position. The standard protocol was used for Botox  as follows, with 5 units of Botox  injected at each site:   -Procerus muscle, midline injection  -Corrugator muscle, bilateral injection  -Frontalis muscle, bilateral injection, with 2 sites each side, medial injection was performed in the upper one third of the frontalis muscle, in the region vertical from the medial inferior edge of the superior orbital rim. The lateral injection was again in the upper one third of the forehead vertically above the lateral limbus of the cornea, 1.5 cm lateral to the medial injection site.  -Temporalis muscle injection, 4 sites, bilaterally. The first injection was 3 cm above the tragus of the ear, second injection site was 1.5 cm to 3 cm up from the first injection site in line with the tragus of the ear. The third injection site was 1.5-3 cm forward between the first 2 injection sites. The fourth injection site was 1.5 cm posterior to the second injection site.  -Occipitalis muscle injection, 3 sites, bilaterally. The first injection was done one half way between the occipital protuberance and the tip of the mastoid process behind the ear. The second injection site was done lateral and superior to the first, 1 fingerbreadth from the first injection. The third injection site was 1 fingerbreadth superiorly and medially from the first injection site.  -Cervical paraspinal muscle injection, 2 sites, bilateral, the first injection site was 1 cm from the midline of the cervical spine, 3 cm inferior to the lower border of the occipital protuberance. The second injection site was 1.5 cm superiorly and laterally to the first injection  site.  -Trapezius muscle injection was performed at 3 sites, bilaterally. The first injection site was in the upper trapezius muscle halfway between the inflection point of the neck, and the acromion. The second injection site was one half way between the acromion and the first injection site. The third injection was done between the first injection site and the inflection point of the neck.   A 200 unit bottle of Botox  was used, 155 units were injected, the rest of the Botox  was wasted. The patient tolerated the procedure well, there were no complications of the above procedure.  Botox  NDC 9976-6078-97 Lot number [id number] Expiration date 06/2026 SP   "

## 2024-08-25 ENCOUNTER — Ambulatory Visit: Admitting: Neurology

## 2024-10-21 ENCOUNTER — Ambulatory Visit: Admitting: Neurology

## 2024-11-04 ENCOUNTER — Ambulatory Visit: Payer: Self-pay | Admitting: Neurology

## 2025-04-13 ENCOUNTER — Ambulatory Visit: Admitting: Neurology
